# Patient Record
Sex: Female | Born: 1960 | Race: White | Hispanic: No | Marital: Married | Smoking: Former smoker
Health system: Southern US, Community
[De-identification: ages and names within clinical notes are randomized; demographics above are authoritative.]

## PROBLEM LIST (undated history)

## (undated) DIAGNOSIS — J45909 Unspecified asthma, uncomplicated: Secondary | ICD-10-CM

## (undated) DIAGNOSIS — J309 Allergic rhinitis, unspecified: Secondary | ICD-10-CM

## (undated) DIAGNOSIS — E785 Hyperlipidemia, unspecified: Secondary | ICD-10-CM

## (undated) DIAGNOSIS — K635 Polyp of colon: Secondary | ICD-10-CM

## (undated) DIAGNOSIS — I10 Essential (primary) hypertension: Secondary | ICD-10-CM

## (undated) HISTORY — PX: APPENDECTOMY: SHX54

## (undated) HISTORY — DX: Essential (primary) hypertension: I10

## (undated) HISTORY — DX: Unspecified asthma, uncomplicated: J45.909

## (undated) HISTORY — DX: Allergic rhinitis, unspecified: J30.9

## (undated) HISTORY — DX: Hyperlipidemia, unspecified: E78.5

## (undated) HISTORY — PX: RHINOPLASTY: SUR1284

## (undated) HISTORY — DX: Polyp of colon: K63.5

---

## 1997-12-18 ENCOUNTER — Encounter: Payer: Self-pay | Admitting: Gynecology

## 1997-12-18 ENCOUNTER — Ambulatory Visit (HOSPITAL_COMMUNITY): Admission: RE | Admit: 1997-12-18 | Discharge: 1997-12-18 | Payer: Self-pay | Admitting: Gynecology

## 1998-03-26 ENCOUNTER — Other Ambulatory Visit: Admission: RE | Admit: 1998-03-26 | Discharge: 1998-03-26 | Payer: Self-pay | Admitting: Gynecology

## 1999-04-18 ENCOUNTER — Other Ambulatory Visit: Admission: RE | Admit: 1999-04-18 | Discharge: 1999-04-18 | Payer: Self-pay | Admitting: Gynecology

## 2000-08-20 ENCOUNTER — Encounter: Payer: Self-pay | Admitting: Gynecology

## 2000-08-20 ENCOUNTER — Ambulatory Visit (HOSPITAL_COMMUNITY): Admission: RE | Admit: 2000-08-20 | Discharge: 2000-08-20 | Payer: Self-pay | Admitting: Gynecology

## 2000-10-20 ENCOUNTER — Other Ambulatory Visit: Admission: RE | Admit: 2000-10-20 | Discharge: 2000-10-20 | Payer: Self-pay | Admitting: Gynecology

## 2003-03-30 ENCOUNTER — Ambulatory Visit (HOSPITAL_COMMUNITY): Admission: RE | Admit: 2003-03-30 | Discharge: 2003-03-30 | Payer: Self-pay | Admitting: Surgery

## 2003-03-30 ENCOUNTER — Encounter (INDEPENDENT_AMBULATORY_CARE_PROVIDER_SITE_OTHER): Payer: Self-pay | Admitting: Specialist

## 2003-11-21 ENCOUNTER — Ambulatory Visit (HOSPITAL_COMMUNITY): Admission: RE | Admit: 2003-11-21 | Discharge: 2003-11-21 | Payer: Self-pay | Admitting: Gynecology

## 2003-12-13 ENCOUNTER — Ambulatory Visit: Payer: Self-pay | Admitting: Family Medicine

## 2003-12-26 ENCOUNTER — Other Ambulatory Visit: Admission: RE | Admit: 2003-12-26 | Discharge: 2003-12-26 | Payer: Self-pay | Admitting: Gynecology

## 2004-11-27 ENCOUNTER — Ambulatory Visit (HOSPITAL_COMMUNITY): Admission: RE | Admit: 2004-11-27 | Discharge: 2004-11-27 | Payer: Self-pay | Admitting: Gynecology

## 2006-01-30 ENCOUNTER — Other Ambulatory Visit: Admission: RE | Admit: 2006-01-30 | Discharge: 2006-01-30 | Payer: Self-pay | Admitting: Gynecology

## 2006-10-02 ENCOUNTER — Ambulatory Visit (HOSPITAL_COMMUNITY): Admission: RE | Admit: 2006-10-02 | Discharge: 2006-10-02 | Payer: Self-pay | Admitting: Obstetrics and Gynecology

## 2007-10-14 ENCOUNTER — Ambulatory Visit: Payer: Self-pay | Admitting: Women's Health

## 2007-10-15 ENCOUNTER — Ambulatory Visit: Payer: Self-pay | Admitting: Women's Health

## 2007-10-15 ENCOUNTER — Encounter: Admission: RE | Admit: 2007-10-15 | Discharge: 2007-10-15 | Payer: Self-pay | Admitting: Gynecology

## 2007-10-15 ENCOUNTER — Encounter: Payer: Self-pay | Admitting: Women's Health

## 2007-10-15 ENCOUNTER — Other Ambulatory Visit: Admission: RE | Admit: 2007-10-15 | Discharge: 2007-10-15 | Payer: Self-pay | Admitting: Gynecology

## 2008-01-14 HISTORY — PX: CHOLECYSTECTOMY: SHX55

## 2008-11-03 ENCOUNTER — Ambulatory Visit (HOSPITAL_COMMUNITY): Admission: RE | Admit: 2008-11-03 | Discharge: 2008-11-03 | Payer: Self-pay | Admitting: Gynecology

## 2009-01-13 DIAGNOSIS — K635 Polyp of colon: Secondary | ICD-10-CM

## 2009-01-13 HISTORY — DX: Polyp of colon: K63.5

## 2009-12-14 ENCOUNTER — Ambulatory Visit (HOSPITAL_COMMUNITY)
Admission: RE | Admit: 2009-12-14 | Discharge: 2009-12-14 | Payer: Self-pay | Source: Home / Self Care | Admitting: Gynecology

## 2010-02-03 ENCOUNTER — Encounter: Payer: Self-pay | Admitting: Obstetrics and Gynecology

## 2010-05-31 ENCOUNTER — Other Ambulatory Visit (HOSPITAL_COMMUNITY)
Admission: RE | Admit: 2010-05-31 | Discharge: 2010-05-31 | Disposition: A | Discharge status: Home / Self Care | Payer: BC Managed Care – PPO | Source: Ambulatory Visit | Attending: Gynecology | Admitting: Gynecology

## 2010-05-31 ENCOUNTER — Other Ambulatory Visit: Payer: Self-pay | Admitting: Women's Health

## 2010-05-31 ENCOUNTER — Encounter (INDEPENDENT_AMBULATORY_CARE_PROVIDER_SITE_OTHER): Payer: BC Managed Care – PPO | Admitting: Women's Health

## 2010-05-31 DIAGNOSIS — Z01419 Encounter for gynecological examination (general) (routine) without abnormal findings: Secondary | ICD-10-CM

## 2010-05-31 DIAGNOSIS — Z124 Encounter for screening for malignant neoplasm of cervix: Secondary | ICD-10-CM | POA: Insufficient documentation

## 2010-05-31 NOTE — Op Note (Signed)
Carla Cline, Carla Cline                         ACCOUNT NO.:  [id number]   MEDICAL RECORD NO.:  [id number]                   PATIENT TYPE:  OIB   LOCATION:  NA                                   FACILITY:  [facility]   PHYSICIAN:  Abigail Miyamoto, M.D.              DATE OF BIRTH:  12/12/1960   DATE OF PROCEDURE:  03/30/2003  DATE OF DISCHARGE:                                 OPERATIVE REPORT   PREOPERATIVE DIAGNOSIS:  Symptomatic cholelithiasis.   POSTOPERATIVE DIAGNOSIS:  Symptomatic cholelithiasis.   PROCEDURE:  Laparoscopic cholecystectomy.   SURGEON:  Abigail Miyamoto, M.D.   ASSISTANT:  Sheppard Plumber. Earlene Plater, M.D.   ANESTHESIA:  General endotracheal anesthesia.   ESTIMATED BLOOD LOSS:  Minimal.   FINDINGS:  The patient was found to have a gallbladder full of gallstones  and chronically thick-walled in appearance.   PROCEDURE IN DETAIL:  The patient was brought to the operating room and  identified as Carla Cline.  She was placed supine on the operating table  and general anesthesia was induced.  Her abdomen was then prepped and draped  in the usual sterile fashion.  Using a #15 blade, a small transverse  incision was made just below the umbilicus through a previous scar.  The  incision was carried down to the fascia, which was then opened with a  scalpel.  A hemostat was then used to pass through the peritoneal cavity.  Next a 0 Vicryl pursestring suture was placed around the fascial opening.  The Hasson port was placed through the abdomen and insufflation of the  abdomen was begun.  A 12 mm port was then placed in the patient's  epigastrium and two 5 mm ports were placed in the patient's right flank  under direct vision.  The gallbladder was then grasped and retracted above  the liver bed.  Several adhesions to the liver and fundus of the gallbladder  were taken down bluntly.  The cystic duct was then dissected out.  It was  viewed circumferentially and a critical window was  obtained.  The cystic  duct was then clipped three times proximally, once distally, and transected  with the scissors.  The cystic artery was then identified and clipped twice  proximally, once distally, and transected as well.  The gallbladder was then  dissected free from the liver bed with the electrocautery.  Hemostasis was  achieved in the liver bed with the electrocautery.  Once the gallbladder was  freed from the liver bed, it was placed in an Endosac and removed through  the incision at the umbilicus.  The liver bed was again examined and  hemostasis was achieved with the cautery.  The abdomen was then thoroughly  irrigated with normal saline.  All ports were then removed under direct  vision and the abdomen was deflated.  All incisions were anesthetized with  0.25% Marcaine and closed with 4-0 Monocryl subcuticular sutures.  Steri-Strips, gauze, and tape were then applied.  The patient tolerated the  procedure well.  All counts were correct at the end of the procedure and the  patient was then extubated in the operating room and taken in stable  condition to the recovery room.                                               Abigail Miyamoto, M.D.    DB/MEDQ  D:  03/30/2003  T:  04/01/2003  Job:  [id number]

## 2011-03-25 ENCOUNTER — Other Ambulatory Visit: Payer: Self-pay | Admitting: Women's Health

## 2011-03-25 DIAGNOSIS — Z1231 Encounter for screening mammogram for malignant neoplasm of breast: Secondary | ICD-10-CM

## 2011-04-18 ENCOUNTER — Ambulatory Visit (HOSPITAL_COMMUNITY): Payer: BC Managed Care – PPO | Attending: Women's Health

## 2011-05-16 ENCOUNTER — Ambulatory Visit (HOSPITAL_COMMUNITY)
Admission: RE | Admit: 2011-05-16 | Discharge: 2011-05-16 | Disposition: A | Discharge status: Home / Self Care | Payer: BC Managed Care – PPO | Source: Ambulatory Visit | Attending: Women's Health | Admitting: Women's Health

## 2011-05-16 DIAGNOSIS — Z1231 Encounter for screening mammogram for malignant neoplasm of breast: Secondary | ICD-10-CM | POA: Insufficient documentation

## 2011-10-15 ENCOUNTER — Encounter: Payer: Self-pay | Admitting: Women's Health

## 2011-10-15 ENCOUNTER — Ambulatory Visit (INDEPENDENT_AMBULATORY_CARE_PROVIDER_SITE_OTHER): Payer: BC Managed Care – PPO | Admitting: Women's Health

## 2011-10-15 VITALS — BP 122/86 | Ht 59.0 in | Wt 149.0 lb

## 2011-10-15 DIAGNOSIS — Z01419 Encounter for gynecological examination (general) (routine) without abnormal findings: Secondary | ICD-10-CM

## 2011-10-15 DIAGNOSIS — D126 Benign neoplasm of colon, unspecified: Secondary | ICD-10-CM

## 2011-10-15 DIAGNOSIS — I10 Essential (primary) hypertension: Secondary | ICD-10-CM | POA: Insufficient documentation

## 2011-10-15 DIAGNOSIS — E7800 Pure hypercholesterolemia, unspecified: Secondary | ICD-10-CM | POA: Insufficient documentation

## 2011-10-15 DIAGNOSIS — K635 Polyp of colon: Secondary | ICD-10-CM | POA: Insufficient documentation

## 2011-10-15 DIAGNOSIS — N912 Amenorrhea, unspecified: Secondary | ICD-10-CM

## 2011-10-15 NOTE — Patient Instructions (Addendum)
Vit D 2000 daily Health Maintenance, Females A healthy lifestyle and preventative care can promote health and wellness.  Maintain regular health, dental, and eye exams.  Eat a healthy diet. Foods like vegetables, fruits, whole grains, low-fat dairy products, and lean protein foods contain the nutrients you need without too many calories. Decrease your intake of foods high in solid fats, added sugars, and salt. Get information about a proper diet from your caregiver, if necessary.  Regular physical exercise is one of the most important things you can do for your health. Most adults should get at least 150 minutes of moderate-intensity exercise (any activity that increases your heart rate and causes you to sweat) each week. In addition, most adults need muscle-strengthening exercises on 2 or more days a week.   Maintain a healthy weight. The body mass index (BMI) is a screening tool to identify possible weight problems. It provides an estimate of body fat based on height and weight. Your caregiver can help determine your BMI, and can help you achieve or maintain a healthy weight. For adults 20 years and older:  A BMI below 18.5 is considered underweight.  A BMI of 18.5 to 24.9 is normal.  A BMI of 25 to 29.9 is considered overweight.  A BMI of 30 and above is considered obese.  Maintain normal blood lipids and cholesterol by exercising and minimizing your intake of saturated fat. Eat a balanced diet with plenty of fruits and vegetables. Blood tests for lipids and cholesterol should begin at age 20 and be repeated every 5 years. If your lipid or cholesterol levels are high, you are over 50, or you are a high risk for heart disease, you may need your cholesterol levels checked more frequently.Ongoing high lipid and cholesterol levels should be treated with medicines if diet and exercise are not effective.  If you smoke, find out from your caregiver how to quit. If you do not use tobacco, do not  start.  If you are pregnant, do not drink alcohol. If you are breastfeeding, be very cautious about drinking alcohol. If you are not pregnant and choose to drink alcohol, do not exceed 1 drink per day. One drink is considered to be 12 ounces (355 mL) of beer, 5 ounces (148 mL) of wine, or 1.5 ounces (44 mL) of liquor.  Avoid use of street drugs. Do not share needles with anyone. Ask for help if you need support or instructions about stopping the use of drugs.  High blood pressure causes heart disease and increases the risk of stroke. Blood pressure should be checked at least every 1 to 2 years. Ongoing high blood pressure should be treated with medicines, if weight loss and exercise are not effective.  If you are 55 to 53 years old, ask your caregiver if you should take aspirin to prevent strokes.  Diabetes screening involves taking a blood sample to check your fasting blood sugar level. This should be done once every 3 years, after age 45, if you are within normal weight and without risk factors for diabetes. Testing should be considered at a younger age or be carried out more frequently if you are overweight and have at least 1 risk factor for diabetes.  Breast cancer screening is essential preventative care for women. You should practice "breast self-awareness." This means understanding the normal appearance and feel of your breasts and may include breast self-examination. Any changes detected, no matter how small, should be reported to a caregiver. Women in their 20s   and 30s should have a clinical breast exam (CBE) by a caregiver as part of a regular health exam every 1 to 3 years. After age 40, women should have a CBE every year. Starting at age 40, women should consider having a mammogram (breast X-ray) every year. Women who have a family history of breast cancer should talk to their caregiver about genetic screening. Women at a high risk of breast cancer should talk to their caregiver about having  an MRI and a mammogram every year.  The Pap test is a screening test for cervical cancer. Women should have a Pap test starting at age 21. Between ages 21 and 29, Pap tests should be repeated every 2 years. Beginning at age 30, you should have a Pap test every 3 years as long as the past 3 Pap tests have been normal. If you had a hysterectomy for a problem that was not cancer or a condition that could lead to cancer, then you no longer need Pap tests. If you are between ages 65 and 70, and you have had normal Pap tests going back 10 years, you no longer need Pap tests. If you have had past treatment for cervical cancer or a condition that could lead to cancer, you need Pap tests and screening for cancer for at least 20 years after your treatment. If Pap tests have been discontinued, risk factors (such as a new sexual partner) need to be reassessed to determine if screening should be resumed. Some women have medical problems that increase the chance of getting cervical cancer. In these cases, your caregiver may recommend more frequent screening and Pap tests.  The human papillomavirus (HPV) test is an additional test that may be used for cervical cancer screening. The HPV test looks for the virus that can cause the cell changes on the cervix. The cells collected during the Pap test can be tested for HPV. The HPV test could be used to screen women aged 30 years and older, and should be used in women of any age who have unclear Pap test results. After the age of 30, women should have HPV testing at the same frequency as a Pap test.  Colorectal cancer can be detected and often prevented. Most routine colorectal cancer screening begins at the age of 50 and continues through age 75. However, your caregiver may recommend screening at an earlier age if you have risk factors for colon cancer. On a yearly basis, your caregiver may provide home test kits to check for hidden blood in the stool. Use of a small camera at  the end of a tube, to directly examine the colon (sigmoidoscopy or colonoscopy), can detect the earliest forms of colorectal cancer. Talk to your caregiver about this at age 50, when routine screening begins. Direct examination of the colon should be repeated every 5 to 10 years through age 75, unless early forms of pre-cancerous polyps or small growths are found.  Hepatitis C blood testing is recommended for all people born from 1945 through 1965 and any individual with known risks for hepatitis C.  Practice safe sex. Use condoms and avoid high-risk sexual practices to reduce the spread of sexually transmitted infections (STIs). Sexually active women aged 25 and younger should be checked for Chlamydia, which is a common sexually transmitted infection. Older women with new or multiple partners should also be tested for Chlamydia. Testing for other STIs is recommended if you are sexually active and at increased risk.  Osteoporosis is a   disease in which the bones lose minerals and strength with aging. This can result in serious bone fractures. The risk of osteoporosis can be identified using a bone density scan. Women ages 65 and over and women at risk for fractures or osteoporosis should discuss screening with their caregivers. Ask your caregiver whether you should be taking a calcium supplement or vitamin D to reduce the rate of osteoporosis.  Menopause can be associated with physical symptoms and risks. Hormone replacement therapy is available to decrease symptoms and risks. You should talk to your caregiver about whether hormone replacement therapy is right for you.  Use sunscreen with a sun protection factor (SPF) of 30 or greater. Apply sunscreen liberally and repeatedly throughout the day. You should seek shade when your shadow is shorter than you. Protect yourself by wearing long sleeves, pants, a wide-brimmed hat, and sunglasses year round, whenever you are outdoors.  Notify your caregiver of new  moles or changes in moles, especially if there is a change in shape or color. Also notify your caregiver if a mole is larger than the size of a pencil eraser.  Stay current with your immunizations. Document Released: 07/15/2010 Document Revised: 03/24/2011 Document Reviewed: 07/15/2010 [employer] Patient Information 2013 [employer].  

## 2011-10-15 NOTE — Progress Notes (Signed)
JIWOO OTT 06/25/1960 [id number]    History:    The patient presents for annual exam.  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 Cycles are every 1-3 months last cycle September and states feels like she is ready to start a period. History of normal Paps and mammograms.   Past medical history, past surgical history, family history and social history were all reviewed and documented in the EPIC chart. Mother with a diagnosis of breast cancer with unknown BRCA testing. History of benign colon polyps 2011. Hypertension and hypercholesterolemia treated by primary care. Daughter [name] doing well, son [name] 18 struggling with school. Engineer, production for [employer].  ROS:  A  ROS was performed and pertinent positives and negatives are included in the history.  Exam:  Filed Vitals:   10/15/11 1515  BP: 122/86    General appearance:  Normal Head/Neck:  Normal, without cervical or supraclavicular adenopathy. Thyroid:  Symmetrical, normal in size, without palpable masses or nodularity. Respiratory  Effort:  Normal  Auscultation:  Clear without wheezing or rhonchi Cardiovascular  Auscultation:  Regular rate, without rubs, murmurs or gallops  Edema/varicosities:  Not grossly evident Abdominal  Soft,nontender, without masses, guarding or rebound.  Liver/spleen:  No organomegaly noted  Hernia:  None appreciated  Skin  Inspection:  Grossly normal  Palpation:  Grossly normal Neurologic/psychiatric  Orientation:  Normal with appropriate conversation.  Mood/affect:  Normal  Genitourinary    Breasts: Examined lying and sitting.     Right: Without masses, retractions, discharge or axillary adenopathy.     Left: Without masses, retractions, discharge or axillary adenopathy.   Inguinal/mons:  Normal without inguinal adenopathy  External genitalia:  Normal  BUS/Urethra/Skene's glands:  Normal  Bladder:  Normal  Vagina:  Normal  Cervix:  Normal  Uterus:   normal in size, shape and  contour.  Midline and mobile  Adnexa/parametria:     Rt: Without masses or tenderness.   Lt: Without masses or tenderness.  Anus and perineum: Normal  Digital rectal exam: Normal sphincter tone without palpated masses or tenderness  Assessment/Plan:  51 y.o. MWF G1 P1 +1 adopted  for annual exam.    Perimenopausal/irregular cycles x6 months Hypertension/hypercholesterolemia-primary care labs and meds Benign colon polyps 2011  Plan: Discussed BRCA testing Will review with her mother who was diagnosed with breast cancer in her 47s. Declines testing today. SBE's, continue annual mammogram, 3D mammogram reviewed. Reviewed importance of exercise, calcium rich diet, vitamin D 2000 daily, menopause reviewed. No Pap history of normal Paps, new screening guidelines reviewed. Home Hemoccult card with instructions given.    Harrington Challenger WHNP, 4:10 PM 10/15/2011

## 2012-02-02 ENCOUNTER — Emergency Department (HOSPITAL_COMMUNITY): Payer: BC Managed Care – PPO

## 2012-02-02 ENCOUNTER — Emergency Department (HOSPITAL_COMMUNITY)
Admission: EM | Admit: 2012-02-02 | Discharge: 2012-02-02 | Disposition: A | Discharge status: Home / Self Care | Payer: BC Managed Care – PPO | Attending: Emergency Medicine | Admitting: Emergency Medicine

## 2012-02-02 ENCOUNTER — Encounter (HOSPITAL_COMMUNITY): Payer: Self-pay | Admitting: Emergency Medicine

## 2012-02-02 DIAGNOSIS — E785 Hyperlipidemia, unspecified: Secondary | ICD-10-CM | POA: Insufficient documentation

## 2012-02-02 DIAGNOSIS — R059 Cough, unspecified: Secondary | ICD-10-CM | POA: Insufficient documentation

## 2012-02-02 DIAGNOSIS — I10 Essential (primary) hypertension: Secondary | ICD-10-CM | POA: Insufficient documentation

## 2012-02-02 DIAGNOSIS — Z79899 Other long term (current) drug therapy: Secondary | ICD-10-CM | POA: Insufficient documentation

## 2012-02-02 DIAGNOSIS — J45901 Unspecified asthma with (acute) exacerbation: Secondary | ICD-10-CM | POA: Insufficient documentation

## 2012-02-02 DIAGNOSIS — Z860100 Personal history of colon polyps, unspecified: Secondary | ICD-10-CM | POA: Insufficient documentation

## 2012-02-02 DIAGNOSIS — J45909 Unspecified asthma, uncomplicated: Secondary | ICD-10-CM

## 2012-02-02 DIAGNOSIS — Z87891 Personal history of nicotine dependence: Secondary | ICD-10-CM | POA: Insufficient documentation

## 2012-02-02 DIAGNOSIS — Z8701 Personal history of pneumonia (recurrent): Secondary | ICD-10-CM | POA: Insufficient documentation

## 2012-02-02 DIAGNOSIS — R05 Cough: Secondary | ICD-10-CM | POA: Insufficient documentation

## 2012-02-02 LAB — BASIC METABOLIC PANEL
Chloride: 99 mEq/L (ref 96–112)
Creatinine, Ser: 0.79 mg/dL (ref 0.50–1.10)
GFR calc Af Amer: >90 mL/min (ref >90)
Glucose, Bld: 124 mg/dL — ABNORMAL HIGH (ref 70–99)
Potassium: 3.8 mEq/L (ref 3.5–5.1)

## 2012-02-02 LAB — CBC WITH DIFFERENTIAL/PLATELET
Basophils Relative: 0 % (ref 0–1)
Eosinophils Absolute: 0.2 10*3/uL (ref 0.0–0.7)
Eosinophils Relative: 2 % (ref 0–5)
Hemoglobin: 16.9 g/dL — ABNORMAL HIGH (ref 12.0–15.0)
Lymphocytes Relative: 42 % (ref 12–46)
Monocytes Relative: 5 % (ref 3–12)
Neutro Abs: 4.5 10*3/uL (ref 1.7–7.7)
Neutrophils Relative %: 50 % (ref 43–77)
Platelets: 260 10*3/uL (ref 150–400)
RDW: 12.8 % (ref 11.5–15.5)

## 2012-02-02 LAB — TROPONIN I: Troponin I: <0.3 ng/mL (ref <0.30)

## 2012-02-02 MED ORDER — PREDNISONE 20 MG PO TABS: 40.0000 mg | ORAL_TABLET | Freq: Every day | ORAL | Status: DC

## 2012-02-02 MED ORDER — ALBUTEROL SULFATE (5 MG/ML) 0.5% IN NEBU
5.0000 mg | INHALATION_SOLUTION | RESPIRATORY_TRACT | Status: AC
  Administered 2012-02-02: 5 mg via RESPIRATORY_TRACT
  Filled 2012-02-02: qty 40

## 2012-02-02 MED ORDER — ONDANSETRON HCL 4 MG/2ML IJ SOLN
4.0000 mg | Freq: Once | INTRAMUSCULAR | Status: AC
  Administered 2012-02-02: 4 mg via INTRAVENOUS
  Filled 2012-02-02: qty 2

## 2012-02-02 MED ORDER — ALBUTEROL SULFATE HFA 108 (90 BASE) MCG/ACT IN AERS: 2.0000 | INHALATION_SPRAY | RESPIRATORY_TRACT | Status: DC | PRN

## 2012-02-02 MED ORDER — METHYLPREDNISOLONE SODIUM SUCC 125 MG IJ SOLR
125.0000 mg | Freq: Once | INTRAMUSCULAR | Status: AC
  Administered 2012-02-02: 125 mg via INTRAVENOUS
  Filled 2012-02-02: qty 2

## 2012-02-02 MED ORDER — ONDANSETRON HCL 4 MG/2ML IJ SOLN
INTRAMUSCULAR | Status: AC
  Filled 2012-02-02: qty 2

## 2012-02-02 NOTE — ED Notes (Signed)
MD at bedside. 

## 2012-02-02 NOTE — ED Provider Notes (Addendum)
History     CSN: [id number]  Arrival date & time 02/02/12  4098   First MD Initiated Contact with Patient 02/02/12 0320      Chief Complaint  Patient presents with  . Shortness of Breath    (Consider location/radiation/quality/duration/timing/severity/associated sxs/prior treatment) HPI Comments: 52 y/o female with hx of htn, hypercholesterolemia who presents with intermittent SOB over the last week - she states that she usually has the SOB when she is exercising - lasting for < 20 minutes and assocaited with a  Cough but tonight the sx became much more prominent causing her to come to the ED - they would not go away and have been present > 3 hours.  They are persistent now, associated with coughing and wheezing but not associated with sore throat, nasal congestion, back pain, swelling in the legs, trauma, travel, recent surgery, hormone use, immobilization, smoking. She does not have a history of asthma or any obstructive pulmonary disease and she has had one episode of pneumonia last year. She takes lisinopril for her blood pressure. She does have an albuterol inhaler which she has been using at the gym which seems to be helping her. This was a leftover medication from her pneumonia last year.  The history is provided by the patient and the spouse.    Past Medical History  Diagnosis Date  . Hypertension   . Colon polyps 2011    benign  . Hyperlipidemia     Past Surgical History  Procedure Date  . Rhinoplasty   . Cesarean section     Family History  Problem Relation Age of Onset  . Hypertension Mother   . Breast cancer Mother     age 54's  . Hypertension Father   . Heart disease Father     History  Substance Use Topics  . Smoking status: Former Smoker    Types: Cigarettes    Quit date: 10/15/1983  . Smokeless tobacco: Not on file  . Alcohol Use: Yes    OB History    Grav Para Term Preterm Abortions TAB SAB Ect Mult Living   1         1      Review of Systems   All other systems reviewed and are negative.    Allergies  Review of patient's allergies indicates no known allergies.  Home Medications   Current Outpatient Rx  Name  Route  Sig  Dispense  Refill  . ALBUTEROL SULFATE HFA 108 (90 BASE) MCG/ACT IN AERS   Inhalation   Inhale 2 puffs into the lungs every 6 (six) hours as needed. For shortness of breath         . VITAMIN C 1000 MG PO TABS   Oral   Take 2,000 mg by mouth daily.         Marland Kitchen HYDROCHLOROTHIAZIDE 12.5 MG PO CAPS   Oral   Take 12.5 mg by mouth daily.         . IBUPROFEN 200 MG PO TABS   Oral   Take 600 mg by mouth every 6 (six) hours as needed. For pain         . IRON PO   Oral   Take 1 tablet by mouth daily. OTC         . LISINOPRIL 20 MG PO TABS   Oral   Take 20 mg by mouth daily.         Marland Kitchen LORATADINE 10 MG PO TABS   Oral  Take 10 mg by mouth daily.         Marland Kitchen SIMVASTATIN 10 MG PO TABS   Oral   Take 10 mg by mouth at bedtime.           . ALBUTEROL SULFATE HFA 108 (90 BASE) MCG/ACT IN AERS   Inhalation   Inhale 2 puffs into the lungs every 4 (four) hours as needed for wheezing or shortness of breath.   1 Inhaler   3   . PREDNISONE 20 MG PO TABS   Oral   Take 2 tablets (40 mg total) by mouth daily.   10 tablet   0     BP 115/74  Pulse 119  Temp 98 F (36.7 C) (Oral)  Resp 20  Ht 5' (1.524 m)  Wt 145 lb (65.772 kg)  BMI 28.32 kg/m2  SpO2 95%  LMP 01/02/2012  Physical Exam  Nursing note and vitals reviewed. Constitutional: She appears well-developed and well-nourished.  HENT:  Head: Normocephalic and atraumatic.  Mouth/Throat: Oropharynx is clear and moist. No oropharyngeal exudate.       Mucous membranes are moist, nasal passages are clear, there is no pharyngeal erythema exudate or hypertrophy or erythema. The posterior pharynx otherwise appears normal.  Eyes: Conjunctivae normal and EOM are normal. Pupils are equal, round, and reactive to light. Right eye exhibits no  discharge. Left eye exhibits no discharge. No scleral icterus.  Neck: Normal range of motion. Neck supple. No JVD present. No thyromegaly present.       There is no lymphadenopathy in the patient's very supple neck  Cardiovascular: Regular rhythm, normal heart sounds and intact distal pulses.  Exam reveals no gallop and no friction rub.   No murmur heard.      Tachycardia to 120 beats per minute, no murmurs, strong pulses at the radial arteries, no JVD  Pulmonary/Chest:       Increased work of breathing is present, she is mildly tachypneic, speaking in full sentences, decreased air movement with mild expiratory wheezing bilaterally. No rales, no accessory muscle use  Abdominal: Soft. Bowel sounds are normal. She exhibits no distension and no mass. There is no tenderness.  Musculoskeletal: Normal range of motion. She exhibits no edema and no tenderness.  Lymphadenopathy:    She has no cervical adenopathy.  Neurological: She is alert. Coordination normal.  Skin: Skin is warm and dry. No rash noted. No erythema.  Psychiatric: She has a normal mood and affect. Her behavior is normal.    ED Course  Procedures (including critical care time)  Labs Reviewed  CBC WITH DIFFERENTIAL - Abnormal; Notable for the following:    RBC 5.51 (*)     Hemoglobin 16.9 (*)     MCHC 36.7 (*)     All other components within normal limits  BASIC METABOLIC PANEL - Abnormal; Notable for the following:    Glucose, Bld 124 (*)     Calcium 11.1 (*)     All other components within normal limits  TROPONIN I  PRO B NATRIURETIC PEPTIDE   Dg Chest 2 View  02/02/2012  *RADIOLOGY REPORT*  Clinical Data: Shortness of breath.  Near-syncope.  CHEST - 2 VIEW  Comparison: Chest radiograph performed 03/28/2003  Findings: The lungs are well-aerated.  Mild peribronchial thickening is noted.  There is no evidence of focal opacification, pleural effusion or pneumothorax.  The heart is normal in size; the mediastinal contour is  within normal limits.  No acute osseous abnormalities are seen.  Clips are noted within the right upper quadrant, reflecting prior cholecystectomy.  IMPRESSION: Mild peribronchial thickening noted; lungs otherwise clear.   Original Report Authenticated By: Tonia Ghent, M.D.      1. Reactive airway disease       MDM  The patient is mildly hypoxic with an oxygen saturation of 95% on room air, a pulse of 120 and is afebrile with a normal blood pressure. She does not have any risk factors for pulmonary embolism, she does have an element of reactive airway disease in her exam but I would also consider angioedema though I don't see any visualized angioedema in her pharynx or mouth. Prednisone, albuterol, chest x-ray and labs pending  ED ECG REPORT  I personally interpreted this EKG   Date: 02/02/2012   Rate: 94  Rhythm: normal sinus rhythm  QRS Axis: normal  Intervals: normal  ST/T Wave abnormalities: normal  Conduction Disutrbances:none  Narrative Interpretation:   Old EKG Reviewed: Since 03/30/2003, no significant changes other than an increase in rate   PA and lateral views of the chest were obtained by digital radiography. I have personally interpreted these x-rays and find her to be no signs of pulmonary infiltrate, cardiomegaly, subdiaphragmatic free air, soft tissue abnormality, no obvious bony abnormalities or fractures.  Lab results are overall very unremarkable and the patient has improved significantly after receiving an albuterol treatment. She has been given steroids, EKG is unremarkable and shows no signs of ischemia, the patient appears stable for discharge with followup with her family Dr.  Vida Roller, MD 02/02/12 9147  Vida Roller, MD 02/02/12 [phone]

## 2012-02-02 NOTE — ED Notes (Signed)
Pt has been having SOB episodes lasting approx 20 minutes for undetermined amount of time, but tonight she had an episode and it didn't go away. Pt is having slightly labored breathing with expiratory wheezing. Pt denies any hx of blood clots and does not take birth control.

## 2012-02-02 NOTE — ED Notes (Signed)
Pt states that she is starting to feel nauseous. Zofran 4mg  ordered per protocol

## 2012-02-06 ENCOUNTER — Ambulatory Visit (INDEPENDENT_AMBULATORY_CARE_PROVIDER_SITE_OTHER): Payer: BC Managed Care – PPO | Admitting: Internal Medicine

## 2012-02-06 ENCOUNTER — Encounter: Payer: Self-pay | Admitting: Internal Medicine

## 2012-02-06 ENCOUNTER — Other Ambulatory Visit: Payer: BC Managed Care – PPO

## 2012-02-06 VITALS — BP 112/76 | HR 72 | Ht 60.0 in | Wt 149.6 lb

## 2012-02-06 DIAGNOSIS — J309 Allergic rhinitis, unspecified: Secondary | ICD-10-CM

## 2012-02-06 DIAGNOSIS — R0609 Other forms of dyspnea: Secondary | ICD-10-CM

## 2012-02-06 MED ORDER — VALSARTAN 40 MG PO TABS: 40.0000 mg | ORAL_TABLET | Freq: Every day | ORAL | Status: DC

## 2012-02-06 NOTE — Patient Instructions (Addendum)
Order- schedule PFT     Dx dyspnea on exertion              Lab- Allergy Profile    Dx allergic rhinitis   Script for Diovan-   For one month, take this instead of Lisinopril. See if there is any impact on breathing or on these episodes of mid-chest pain  Ok to continue using the rescue inhaler but please call our office with the names of the inhalers you have for our records.

## 2012-02-06 NOTE — Progress Notes (Signed)
02/06/12- 52 yoF former smoker -Self Referral-coughing spells; felt like she swallowed something and couldnt breathe-went to ER; also SOB happens about 20 minutes when working out. Reports breathing problems for 6 months especially during exertion such that she has to stop early. Cough and wheeze. Inhaler seems to help. Stress makes it worse. One week ago she had a new "esophageal" pain during stress, not while eating. She said was like something stuck. EKG was okay. Similar episode woke her from sleep and got more intense with shortness of breath over several minutes. Nebulizer at emergency room definitely helped. Some sense of food hang up. Now 3 episodes like this. TUMS antacid does not help. She is tapering prednisone. History of seasonal allergy treated with loratadine. No history of asthma. Pneumonia 6 months ago. She has been on lisinopril and we discussed possible effects of that on the upper airway  Reflux in the past. CXR 02/02/12- IMPRESSION:  Mild peribronchial thickening noted; lungs otherwise clear.  Original Report Authenticated By: Tonia Ghent, M.D.   Prior to Admission medications   Medication Sig Start Date End Date Taking? Authorizing Provider  albuterol (PROVENTIL HFA;VENTOLIN HFA) 108 (90 BASE) MCG/ACT inhaler Inhale 2 puffs into the lungs every 4 (four) hours as needed for wheezing or shortness of breath. 02/02/12  Yes Vida Roller, MD  Ascorbic Acid (VITAMIN C) 1000 MG tablet Take 2,000 mg by mouth daily.   Yes Historical Provider, MD  Calcium Carb-Cholecalciferol (CALCIUM + D3) 600-200 MG-UNIT TABS Take 2 tablets by mouth daily.   Yes Historical Provider, MD  Cholecalciferol (VITAMIN D) 2000 UNITS tablet Take 2,000 Units by mouth daily.   Yes Historical Provider, MD  ibuprofen (ADVIL,MOTRIN) 200 MG tablet Take 600 mg by mouth every 6 (six) hours as needed. For pain   Yes Historical Provider, MD  IRON PO Take 1 tablet by mouth daily. OTC   Yes Historical Provider, MD    lisinopril (PRINIVIL,ZESTRIL) 10 MG tablet Take 10 mg by mouth daily.   Yes Historical Provider, MD  loratadine (CLARITIN) 10 MG tablet Take 10 mg by mouth daily.   Yes Historical Provider, MD  predniSONE (DELTASONE) 20 MG tablet Take 2 tablets (40 mg total) by mouth daily. 02/02/12  Yes Vida Roller, MD  simvastatin (ZOCOR) 10 MG tablet Take 10 mg by mouth at bedtime.     Yes Historical Provider, MD  triamterene-hydrochlorothiazide (DYAZIDE) 37.5-25 MG per capsule Take 1 tablet by mouth daily. 12/16/11  Yes Historical Provider, MD  valsartan (DIOVAN) 40 MG tablet Take 40 mg by mouth daily.   Yes Historical Provider, MD  valsartan (DIOVAN) 40 MG tablet Take 1 tablet (40 mg total) by mouth daily. 02/06/12 03/08/12  Waymon Budge, MD   Past Medical History  Diagnosis Date  . Hypertension   . Colon polyps 2011    benign  . Hyperlipidemia   . Allergic rhinitis    Past Surgical History  Procedure Date  . Rhinoplasty 90's  . Cesarean section   . Cholecystectomy 2010   Family History  Problem Relation Age of Onset  . Hypertension Mother   . Breast cancer Mother     age 8's  . Hypertension Father   . Heart disease Father    History   Social History  . Marital Status: Married    Spouse Name: N/A    Number of Children: 2  . Years of Education: N/A   Occupational History  . 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 Smoking status: Former Smoker -- 1.0 packs/day for 10 years    Types: Cigarettes    Quit date: 10/15/1983  . Smokeless tobacco: Not on file  . Alcohol Use: Yes     Comment: weekend; wine(red)  . Drug Use: No  . Sexually Active: Yes    Birth Control/ Protection: None   Other Topics Concern  . Not on file   Social History Narrative   Has 1 biological child and 1 adopted child   ROS-see HPI Constitutional:   No-   weight loss, night sweats, fevers, chills, fatigue, lassitude. HEENT:   No-  headaches, difficulty swallowing, tooth/dental  problems, sore throat,       No-  sneezing, itching, ear ache, nasal congestion, post nasal drip,  CV:  No-   chest pain, orthopnea, PND, swelling in lower extremities, anasarca,                                  dizziness, palpitations Resp: +  shortness of breath with exertion or at rest.              No-   productive cough,  + non-productive cough,  No- coughing up of blood.              No-   change in color of mucus.  + Is wheezing.   Skin: No-   rash or lesions. GI:  No-   heartburn, indigestion, abdominal pain, nausea, vomiting, diarrhea,                 change in bowel habits, loss of appetite GU: No-   dysuria, change in color of urine, no urgency or frequency.  No- flank pain. MS:  No-   joint pain or swelling.  No- decreased range of motion.  No- back pain. Neuro-     nothing unusual Psych:  No- change in mood or affect. No depression or anxiety.  No memory loss.  OBJ- Physical Exam General- Alert, Oriented, Affect-appropriate/ calm , Distress- none acute Skin- rash-none, lesions- none, excoriation- none Lymphadenopathy- none Head- atraumatic            Eyes- Gross vision intact, PERRLA, conjunctivae and secretions clear            Ears- Hearing, canals-normal            Nose- Clear, no-Septal dev, mucus, polyps, erosion, perforation             Throat- Mallampati III , mucosa clear , drainage- none, tonsils- atrophic Neck- flexible , trachea midline, no stridor , thyroid nl, carotid no bruit Chest - symmetrical excursion , unlabored           Heart/CV- RRR , no murmur , no gallop  , no rub, nl s1 s2                           - JVD- none , edema- none, stasis changes- none, varices- none           Lung- clear to P&A, wheeze- none, cough- none , dullness-none, rub- none           Chest wall-  Abd- tender-no, distended-no, bowel sounds-present, HSM- no Br/ Gen/ Rectal- Not done, not indicated Extrem- cyanosis- none, clubbing, none, atrophy- none, strength- nl Neuro- grossly  intact to observation

## 2012-02-09 LAB — ALLERGY FULL PROFILE
Allergen, D pternoyssinus,d7: <0.1 kU/L
Allergen,Goose feathers, e70: <0.1 kU/L
Aspergillus fumigatus, IgG: <0.1 kU/L
Bermuda Grass: <0.1 kU/L
Candida Albicans: <0.1 kU/L
Common Ragweed: <0.1 kU/L
D. farinae: <0.1 kU/L
Dog Dander: <0.1 kU/L
Goldenrod: <0.1 kU/L
Helminthosporium halodes: <0.1 kU/L
House Dust Hollister: <0.1 kU/L
IgE (Immunoglobulin E), Serum: 41.3 IU/mL (ref 0.0–180.0)
Lamb's Quarters: <0.1 kU/L
Plantain: <0.1 kU/L
Stemphylium Botryosum: <0.1 kU/L
Timothy Grass: <0.1 kU/L

## 2012-02-12 NOTE — Progress Notes (Signed)
Quick Note:  Pt aware of results. ______ 

## 2012-02-16 DIAGNOSIS — J4520 Mild intermittent asthma, uncomplicated: Secondary | ICD-10-CM | POA: Insufficient documentation

## 2012-02-16 NOTE — Assessment & Plan Note (Signed)
Exercise sessions are shortened because of dyspnea. Cough and wheeze. Relieved by bronchodilators. This sounds like an asthma pattern. Plan-recommend that she talk with her doctor about stopping lisinopril. Allergy profile, PFT

## 2012-03-06 ENCOUNTER — Other Ambulatory Visit: Payer: Self-pay | Admitting: Internal Medicine

## 2012-03-18 ENCOUNTER — Encounter: Payer: Self-pay | Admitting: Internal Medicine

## 2012-03-18 ENCOUNTER — Ambulatory Visit (INDEPENDENT_AMBULATORY_CARE_PROVIDER_SITE_OTHER): Payer: BC Managed Care – PPO | Admitting: Internal Medicine

## 2012-03-18 VITALS — BP 128/80 | HR 100 | Ht 60.0 in | Wt 149.0 lb

## 2012-03-18 DIAGNOSIS — R0989 Other specified symptoms and signs involving the circulatory and respiratory systems: Secondary | ICD-10-CM

## 2012-03-18 DIAGNOSIS — R0609 Other forms of dyspnea: Secondary | ICD-10-CM

## 2012-03-18 DIAGNOSIS — J45909 Unspecified asthma, uncomplicated: Secondary | ICD-10-CM

## 2012-03-18 DIAGNOSIS — J4520 Mild intermittent asthma, uncomplicated: Secondary | ICD-10-CM

## 2012-03-18 LAB — PULMONARY FUNCTION TEST

## 2012-03-18 MED ORDER — ALBUTEROL SULFATE HFA 108 (90 BASE) MCG/ACT IN AERS: 2.0000 | INHALATION_SPRAY | RESPIRATORY_TRACT | Status: DC | PRN

## 2012-03-18 NOTE — Progress Notes (Signed)
PFT done today. 

## 2012-03-18 NOTE — Progress Notes (Signed)
02/06/12- 76 yoF former smoker -Self Referral-coughing spells; felt like she swallowed something and couldnt breathe-went to ER; also SOB happens about 20 minutes when working out. Reports breathing problems for 6 months especially during exertion such that she has to stop early. Cough and wheeze. Inhaler seems to help. Stress makes it worse. One week ago she had a new "esophageal" pain during stress, not while eating. She said was like something stuck. EKG was okay. Similar episode woke her from sleep and got more intense with shortness of breath over several minutes. Nebulizer at emergency room definitely helped. Some sense of food hang up. Now 3 episodes like this. TUMS antacid does not help. She is tapering prednisone. History of seasonal allergy treated with loratadine. No history of asthma. Pneumonia 6 months ago. She has been on lisinopril and we discussed possible effects of that on the upper airway  Reflux in the past. CXR 02/02/12- IMPRESSION:  Mild peribronchial thickening noted; lungs otherwise clear.  Original Report Authenticated By: Tonia Ghent, M.D.   03/18/12- 19 yoF former smoker followed for dyspnea and cough FOLLOWS FOR: review PFT results with patient; has been using rescue inhaler only prior to working out.  Substernal pain resolved. Notes sinus pressure, post-nasal drip perennial. Was on allergy shots in the past. PFT 03/18/12- Mild obstructive airways disease, small airways, with response to bronchodilator. DLCO mildly reduced. FVC 2.03/ 73%, FEV1 1.50/ 72%, FEV1/FVC 0.74, TLC 90%, DLCO 72%.  ROS-see HPI Constitutional:   No-   weight loss, night sweats, fevers, chills, fatigue, lassitude. HEENT:   No-  headaches, difficulty swallowing, tooth/dental problems, sore throat,       No-  sneezing, itching, ear ache, nasal congestion, post nasal drip,  CV:  No-   chest pain, orthopnea, PND, swelling in lower extremities, anasarca,                                  dizziness,  palpitations Resp: +  shortness of breath with exertion or at rest.              No-   productive cough,  No- non-productive cough,  No- coughing up of blood.              No-   change in color of mucus.  No- wheezing.   Skin: No-   rash or lesions. GI:  No-   heartburn, indigestion, abdominal pain, nausea, vomiting,  GU:  MS:  No-   joint pain or swelling.  Neuro-     nothing unusual Psych:  No- change in mood or affect. No depression or anxiety.  No memory loss.  OBJ- Physical Exam General- Alert, Oriented, Affect-appropriate/ calm , Distress- none acute Skin- rash-none, lesions- none, excoriation- none Lymphadenopathy- none Head- atraumatic            Eyes- Gross vision intact, PERRLA, conjunctivae and secretions clear            Ears- Hearing, canals-normal            Nose- Clear, no-Septal dev, mucus, polyps, erosion, perforation             Throat- Mallampati III , mucosa clear , drainage- none, tonsils- atrophic Neck- flexible , trachea midline, no stridor , thyroid nl, carotid no bruit Chest - symmetrical excursion , unlabored           Heart/CV- RRR , no murmur , no gallop  ,  no rub, nl s1 s2                           - JVD- none , edema- none, stasis changes- none, varices- none           Lung- clear to P&A, wheeze- none, cough- none , dullness-none, rub- none           Chest wall-  Abd-  Br/ Gen/ Rectal- Not done, not indicated Extrem- cyanosis- none, clubbing, none, atrophy- none, strength- nl Neuro- grossly intact to observation

## 2012-03-18 NOTE — Patient Instructions (Addendum)
Ok to use your albuterol HFA rescue inhaler if needed  Please call as needed

## 2012-03-19 NOTE — Assessment & Plan Note (Signed)
Good control using just a rescue inhaler before exercise, 2-3 days/ week.  This is appropriate for now. I have given permission not to use the Qvar maintenance inhaler now, as we watch over time. She couldn't tell that it made any difference.

## 2012-04-08 ENCOUNTER — Other Ambulatory Visit: Payer: Self-pay | Admitting: Internal Medicine

## 2012-04-16 ENCOUNTER — Telehealth: Payer: Self-pay | Admitting: Internal Medicine

## 2012-04-16 MED ORDER — VALSARTAN 40 MG PO TABS: 40.0000 mg | ORAL_TABLET | Freq: Every day | ORAL | Status: DC

## 2012-04-16 NOTE — Telephone Encounter (Signed)
Pt needed a refill on Diovan. I have sent in refill on this for 90 day supply per the pt's request. Nothing further was needed.

## 2012-04-27 ENCOUNTER — Encounter: Payer: Self-pay | Admitting: *Deleted

## 2012-04-27 NOTE — Progress Notes (Signed)
Patient ID: Carla Cline, female   DOB: Aug 03, 1960, 52 y.o.   MRN: [id number] Pt called requesting medication for hot flashes, I informed pt that OV would be require for new start on medication. She will call back to schedule appt.

## 2012-05-12 ENCOUNTER — Ambulatory Visit
Admission: RE | Admit: 2012-05-12 | Discharge: 2012-05-12 | Disposition: A | Discharge status: Home / Self Care | Payer: BC Managed Care – PPO | Source: Ambulatory Visit | Attending: Family Medicine | Admitting: Family Medicine

## 2012-05-12 ENCOUNTER — Other Ambulatory Visit (HOSPITAL_COMMUNITY): Payer: Self-pay | Admitting: Family Medicine

## 2012-05-12 DIAGNOSIS — R220 Localized swelling, mass and lump, head: Secondary | ICD-10-CM

## 2012-05-12 DIAGNOSIS — J3489 Other specified disorders of nose and nasal sinuses: Secondary | ICD-10-CM

## 2012-09-24 ENCOUNTER — Ambulatory Visit (INDEPENDENT_AMBULATORY_CARE_PROVIDER_SITE_OTHER): Payer: BC Managed Care – PPO | Admitting: Internal Medicine

## 2012-09-24 ENCOUNTER — Encounter: Payer: Self-pay | Admitting: Internal Medicine

## 2012-09-24 VITALS — BP 130/86 | HR 85 | Ht 60.0 in | Wt 147.8 lb

## 2012-09-24 DIAGNOSIS — J45909 Unspecified asthma, uncomplicated: Secondary | ICD-10-CM

## 2012-09-24 DIAGNOSIS — J4520 Mild intermittent asthma, uncomplicated: Secondary | ICD-10-CM

## 2012-09-24 MED ORDER — MOMETASONE FURO-FORMOTEROL FUM 100-5 MCG/ACT IN AERO: INHALATION_SPRAY | RESPIRATORY_TRACT | Status: DC

## 2012-09-24 NOTE — Progress Notes (Signed)
02/06/12- 21 yoF former smoker -Self Referral-coughing spells; felt like she swallowed something and couldnt breathe-went to ER; also SOB happens about 20 minutes when working out. Reports breathing problems for 6 months especially during exertion such that she has to stop early. Cough and wheeze. Inhaler seems to help. Stress makes it worse. One week ago she had a new "esophageal" pain during stress, not while eating. She said was like something stuck. EKG was okay. Similar episode woke her from sleep and got more intense with shortness of breath over several minutes. Nebulizer at emergency room definitely helped. Some sense of food hang up. Now 3 episodes like this. TUMS antacid does not help. She is tapering prednisone. History of seasonal allergy treated with loratadine. No history of asthma. Pneumonia 6 months ago. She has been on lisinopril and we discussed possible effects of that on the upper airway  Reflux in the past. CXR 02/02/12- IMPRESSION:  Mild peribronchial thickening noted; lungs otherwise clear.  Original Report Authenticated By: Tonia Ghent, M.D.   03/18/12- 68 yoF former smoker followed for dyspnea and cough FOLLOWS FOR: review PFT results with patient; has been using rescue inhaler only prior to working out.  Substernal pain resolved. Notes sinus pressure, post-nasal drip perennial. Was on allergy shots in the past. PFT 03/18/12- Mild obstructive airways disease, small airways, with response to bronchodilator. DLCO mildly reduced. FVC 2.03/ 73%, FEV1 1.50/ 72%, FEV1/FVC 0.74, TLC 90%, DLCO 72%.  09/24/12- 1 yoF former smoker followed for dyspnea and cough. Allergy vaccine at [facility] FOLLOWS FOR:  Having some SOB and wheezing x1 weeks. Denies f/c/s or tightness in chest Wheezing more in the last couple of weeks. Rescue inhaler helps at the gym.   ROS-see HPI Constitutional:   No-   weight loss, night sweats, fevers, chills, fatigue, lassitude. HEENT:   No-  headaches, difficulty  swallowing, tooth/dental problems, sore throat,       No-  sneezing, itching, ear ache, nasal congestion, post nasal drip,  CV:  No-   chest pain, orthopnea, PND, swelling in lower extremities, anasarca, dizziness, palpitations Resp: +  shortness of breath with exertion or at rest.              No-   productive cough,  No- non-productive cough,  No- coughing up of blood.              No-   change in color of mucus.  + wheezing.   Skin: No-   rash or lesions. GI:  No-   heartburn, indigestion, abdominal pain, nausea, vomiting,  GU:  MS:  No-   joint pain or swelling.  Neuro-     nothing unusual Psych:  No- change in mood or affect. No depression or anxiety.  No memory loss.  OBJ- Physical Exam General- Alert, Oriented, Affect-appropriate/ calm , Distress- none acute Skin- rash-none, lesions- none, excoriation- none Lymphadenopathy- none Head- atraumatic            Eyes- Gross vision intact, PERRLA, conjunctivae and secretions clear            Ears- Hearing, canals-normal            Nose- Clear, no-Septal dev, mucus, polyps, erosion, perforation             Throat- Mallampati III , mucosa clear , drainage- none, tonsils- atrophic Neck- flexible , trachea midline, no stridor , thyroid nl, carotid no bruit Chest - symmetrical excursion , unlabored  Heart/CV- RRR , no murmur , no gallop  , no rub, nl s1 s2                           - JVD- none , edema- none, stasis changes- none, varices- none           Lung- clear to P&A, wheezy cough+ , dullness-none, rub- none           Chest wall-  Abd-  Br/ Gen/ Rectal- Not done, not indicated Extrem- cyanosis- none, clubbing, none, atrophy- none, strength- nl Neuro- grossly intact to observation

## 2012-09-24 NOTE — Patient Instructions (Addendum)
Sample and script Dulera 100 maintenance inhaler  2 puffs, then rinse mouth twice daily  You can continue to use your rescue inhaler as needed

## 2012-10-03 NOTE — Assessment & Plan Note (Signed)
Acute exacerbation Plan-add Dulera 100 maintenance controller

## 2012-10-11 ENCOUNTER — Other Ambulatory Visit: Payer: Self-pay | Admitting: Internal Medicine

## 2012-10-12 NOTE — Telephone Encounter (Signed)
diovan last refilled 04/16/12 #90 x 1 refill. Please advise if okay to refill or does this need to go through PCP? Thanks Last OV 09/24/12 with [facility]

## 2012-10-12 NOTE — Telephone Encounter (Signed)
This should go through her PCP for long term BP management, Thanks

## 2012-10-15 ENCOUNTER — Encounter: Payer: BC Managed Care – PPO | Admitting: Women's Health

## 2012-10-18 ENCOUNTER — Other Ambulatory Visit: Payer: Self-pay | Admitting: Internal Medicine

## 2012-10-19 NOTE — Telephone Encounter (Signed)
If this was our prescription, then ok to refill

## 2012-10-19 NOTE — Telephone Encounter (Signed)
Please advise if okay to give refill or go through patients PCP.

## 2012-11-05 ENCOUNTER — Other Ambulatory Visit (HOSPITAL_COMMUNITY)
Admission: RE | Admit: 2012-11-05 | Discharge: 2012-11-05 | Disposition: A | Discharge status: Home / Self Care | Payer: BC Managed Care – PPO | Source: Ambulatory Visit | Attending: Gynecology | Admitting: Gynecology

## 2012-11-05 ENCOUNTER — Ambulatory Visit (INDEPENDENT_AMBULATORY_CARE_PROVIDER_SITE_OTHER): Payer: BC Managed Care – PPO | Admitting: Women's Health

## 2012-11-05 ENCOUNTER — Encounter: Payer: Self-pay | Admitting: Women's Health

## 2012-11-05 VITALS — BP 126/80 | Ht 59.25 in | Wt 146.0 lb

## 2012-11-05 DIAGNOSIS — Z01419 Encounter for gynecological examination (general) (routine) without abnormal findings: Secondary | ICD-10-CM

## 2012-11-05 NOTE — Progress Notes (Signed)
Carla Cline 04-Apr-1960 [id number]    History:    The patient presents for annual exam.  2013 cycles every 2-3 months, no cycle for 6 months, last cycle last month. 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 Normal Pap and mammogram history. Benign colon polyp 2011. Primary care manages hypertension/hypercholesterolemia/asthma. Mother hypertension, breast cancer in her 64s BRCA status unknown.    Past medical history, past surgical history, family history and social history were all reviewed and documented in the EPIC chart. Works at [employer]. [name] doing well, [name] in college  doing okay. Father died of heart disease age 69.   ROS:  A  ROS was performed and pertinent positives and negatives are included in the history.  Exam:  Filed Vitals:   11/05/12 1511  BP: 126/80    General appearance:  Normal Head/Neck:  Normal, without cervical or supraclavicular adenopathy. Thyroid:  Symmetrical, normal in size, without palpable masses or nodularity. Respiratory  Effort:  Normal  Auscultation:  Clear without wheezing or rhonchi Cardiovascular  Auscultation:  Regular rate, without rubs, murmurs or gallops  Edema/varicosities:  Not grossly evident Abdominal  Soft,nontender, without masses, guarding or rebound.  Liver/spleen:  No organomegaly noted  Hernia:  None appreciated  Skin  Inspection:  Grossly normal  Palpation:  Grossly normal Neurologic/psychiatric  Orientation:  Normal with appropriate conversation.  Mood/affect:  Normal  Genitourinary    Breasts: Examined lying and sitting.     Right: Without masses, retractions, discharge or axillary adenopathy.     Left: Without masses, retractions, discharge or axillary adenopathy.   Inguinal/mons:  Normal without inguinal adenopathy  External genitalia:  Normal  BUS/Urethra/Skene's glands:  Normal  Bladder:  Normal  Vagina:  Normal  Cervix:  Normal  Uterus:   normal in size, shape and contour.  Midline and  mobile  Adnexa/parametria:     Rt: Without masses or tenderness.   Lt: Without masses or tenderness.  Anus and perineum: Normal  Digital rectal exam: Normal sphincter tone without palpated masses or tenderness  Assessment/Plan:  52 y.o. MWF G1P1+1 adopted for annual exam with no complaints.  Perimenopausal Hypertension/hypercholesterolemia/asthma-primary care manages labs and meds  Plan: Menopause reviewed, minimal symptoms currently. SBE's, continue annual mammogram, overdue instructed to schedule. Calcium rich diet, vitamin D 2000 daily, continue regular exercise and healthy lifestyle. Pap, Pap normal 2012, new screening guidelines reviewed. Harrington Challenger [facility], 3:58 PM 11/05/2012

## 2012-11-05 NOTE — Patient Instructions (Addendum)
Health Recommendations for Postmenopausal Women Respected and ongoing research has looked at the most common causes of death, disability, and poor quality of life in postmenopausal women. The causes include heart disease, diseases of blood vessels, diabetes, depression, cancer, and bone loss (osteoporosis). Many things can be done to help lower the chances of developing these and other common problems: CARDIOVASCULAR DISEASE Heart Disease: A heart attack is a medical emergency. Know the signs and symptoms of a heart attack. Below are things women can do to reduce their risk for heart disease.   Do not smoke. If you smoke, quit.  Aim for a healthy weight. Being overweight causes many preventable deaths. Eat a healthy and balanced diet and drink an adequate amount of liquids.  Get moving. Make a commitment to be more physically active. Aim for 30 minutes of activity on most, if not all days of the week.  Eat for heart health. Choose a diet that is low in saturated fat and cholesterol and eliminate trans fat. Include whole grains, vegetables, and fruits. Read and understand the labels on food containers before buying.  Know your numbers. Ask your caregiver to check your blood pressure, cholesterol (total, HDL, LDL, triglycerides) and blood glucose. Work with your caregiver on improving your entire clinical picture.  High blood pressure. Limit or stop your table salt intake (try salt substitute and food seasonings). Avoid salty foods and drinks. Read labels on food containers before buying. Eating well and exercising can help control high blood pressure. STROKE  Stroke is a medical emergency. Stroke may be the result of a blood clot in a blood vessel in the brain or by a brain hemorrhage (bleeding). Know the signs and symptoms of a stroke. To lower the risk of developing a stroke:  Avoid fatty foods.  Quit smoking.  Control your diabetes, blood pressure, and irregular heart rate. THROMBOPHLEBITIS  (BLOOD CLOT) OF THE LEG  Becoming overweight and leading a stationary lifestyle may also contribute to developing blood clots. Controlling your diet and exercising will help lower the risk of developing blood clots. CANCER SCREENING  Breast Cancer: Take steps to reduce your risk of breast cancer.  You should practice "breast self-awareness." This means understanding the normal appearance and feel of your breasts and should include breast self-examination. Any changes detected, no matter how small, should be reported to your caregiver.  After age 40, you should have a clinical breast exam (CBE) every year.  Starting at age 40, you should consider having a mammogram (breast X-ray) every year.  If you have a family history of breast cancer, talk to your caregiver about genetic screening.  If you are at high risk for breast cancer, talk to your caregiver about having an MRI and a mammogram every year.  Intestinal or Stomach Cancer: Tests to consider are a rectal exam, fecal occult blood, sigmoidoscopy, and colonoscopy. Women who are high risk may need to be screened at an earlier age and more often.  Cervical Cancer:  Beginning at age 30, you should have a Pap test every 3 years as long as the past 3 Pap tests have been normal.  If you have had past treatment for cervical cancer or a condition that could lead to cancer, you need Pap tests and screening for cancer for at least 20 years after your treatment.  If you had a hysterectomy for a problem that was not cancer or a condition that could lead to cancer, then you no longer need Pap tests.    If you are between ages 65 and 70, and you have had normal Pap tests going back 10 years, you no longer need Pap tests.  If Pap tests have been discontinued, risk factors (such as a new sexual partner) need to be reassessed to determine if screening should be resumed.  Some medical problems can increase the chance of getting cervical cancer. In these  cases, your caregiver may recommend more frequent screening and Pap tests.  Uterine Cancer: If you have vaginal bleeding after reaching menopause, you should notify your caregiver.  Ovarian cancer: Other than yearly pelvic exams, there are no reliable tests available to screen for ovarian cancer at this time except for yearly pelvic exams.  Lung Cancer: Yearly chest X-rays can detect lung cancer and should be done on high risk women, such as cigarette smokers and women with chronic lung disease (emphysema).  Skin Cancer: A complete body skin exam should be done at your yearly examination. Avoid overexposure to the sun and ultraviolet light lamps. Use a strong sun block cream when in the sun. All of these things are important in lowering the risk of skin cancer. MENOPAUSE Menopause Symptoms: Hormone therapy products are effective for treating symptoms associated with menopause:  Moderate to severe hot flashes.  Night sweats.  Mood swings.  Headaches.  Tiredness.  Loss of sex drive.  Insomnia.  Other symptoms. Hormone replacement carries certain risks, especially in older women. Women who use or are thinking about using estrogen or estrogen with progestin treatments should discuss that with their caregiver. Your caregiver will help you understand the benefits and risks. The ideal dose of hormone replacement therapy is not known. The Food and Drug Administration (FDA) has concluded that hormone therapy should be used only at the lowest doses and for the shortest amount of time to reach treatment goals.  OSTEOPOROSIS Protecting Against Bone Loss and Preventing Fracture: If you use hormone therapy for prevention of bone loss (osteoporosis), the risks for bone loss must outweigh the risk of the therapy. Ask your caregiver about other medications known to be safe and effective for preventing bone loss and fractures. To guard against bone loss or fractures, the following is recommended:  If  you are less than age 50, take 1000 mg of calcium and at least 600 mg of Vitamin D per day.  If you are greater than age 50 but less than age 70, take 1200 mg of calcium and at least 600 mg of Vitamin D per day.  If you are greater than age 70, take 1200 mg of calcium and at least 800 mg of Vitamin D per day. Smoking and excessive alcohol intake increases the risk of osteoporosis. Eat foods rich in calcium and vitamin D and do weight bearing exercises several times a week as your caregiver suggests. DIABETES Diabetes Melitus: If you have Type I or Type 2 diabetes, you should keep your blood sugar under control with diet, exercise and recommended medication. Avoid too many sweets, starchy and fatty foods. Being overweight can make control more difficult. COGNITION AND MEMORY Cognition and Memory: Menopausal hormone therapy is not recommended for the prevention of cognitive disorders such as Alzheimer's disease or memory loss.  DEPRESSION  Depression may occur at any age, but is common in elderly women. The reasons may be because of physical, medical, social (loneliness), or financial problems and needs. If you are experiencing depression because of medical problems and control of symptoms, talk to your caregiver about this. Physical activity and   exercise may help with mood and sleep. Community and volunteer involvement may help your sense of value and worth. If you have depression and you feel that the problem is getting worse or becoming severe, talk to your caregiver about treatment options that are best for you. ACCIDENTS  Accidents are common and can be serious in the elderly woman. Prepare your house to prevent accidents. Eliminate throw rugs, place hand bars in the bath, shower and toilet areas. Avoid wearing high heeled shoes or walking on wet, snowy, and icy areas. Limit or stop driving if you have vision or hearing problems, or you feel you are unsteady with you movements and  reflexes. HEPATITIS C Hepatitis C is a type of viral infection affecting the liver. It is spread mainly through contact with blood from an infected person. It can be treated, but if left untreated, it can lead to severe liver damage over years. Many people who are infected do not know that the virus is in their blood. If you are a "baby-boomer", it is recommended that you have one screening test for Hepatitis C. IMMUNIZATIONS  Several immunizations are important to consider having during your senior years, including:   Tetanus, diptheria, and pertussis booster shot.  Influenza every year before the flu season begins.  Pneumonia vaccine.  Shingles vaccine.  Others as indicated based on your specific needs. Talk to your caregiver about these. Document Released: 02/21/2005 Document Revised: 12/17/2011 Document Reviewed: 10/18/2007 [facility] Patient Information 2014 [place], [place].  Vit D 2000 daily

## 2012-11-19 ENCOUNTER — Ambulatory Visit: Payer: BC Managed Care – PPO | Admitting: Internal Medicine

## 2013-02-26 ENCOUNTER — Other Ambulatory Visit: Payer: Self-pay | Admitting: Internal Medicine

## 2013-02-28 NOTE — Telephone Encounter (Signed)
Please advise if okay to refill or go through PCP. Thanks.

## 2013-02-28 NOTE — Telephone Encounter (Signed)
Ok to refill until next ov

## 2013-03-02 NOTE — Telephone Encounter (Signed)
I had okay'd this earlier

## 2013-04-19 ENCOUNTER — Encounter: Payer: Self-pay | Admitting: Women's Health

## 2013-04-19 ENCOUNTER — Telehealth: Payer: Self-pay | Admitting: *Deleted

## 2013-04-19 ENCOUNTER — Ambulatory Visit (INDEPENDENT_AMBULATORY_CARE_PROVIDER_SITE_OTHER): Payer: BC Managed Care – PPO | Admitting: Women's Health

## 2013-04-19 DIAGNOSIS — N63 Unspecified lump in unspecified breast: Secondary | ICD-10-CM

## 2013-04-19 DIAGNOSIS — R928 Other abnormal and inconclusive findings on diagnostic imaging of breast: Secondary | ICD-10-CM

## 2013-04-19 NOTE — Progress Notes (Signed)
Patient ID: Carla Cline, female   DOB: [birth year], 53 y.o.   MRN: [id number] Presents with complaint of questionable left breast nodule with tenderness at times. Recently had several skin tags removed questionable nodule noted by dermatologist. Normal mammogram history, last mammogram 05/2011. Mother history of breast cancer in her 93s living. Has had a nontender 3 cm sebaceous cyst in left axilla for many years.   Exam: Appears well. Breast exam sitting and  lying position, no visible retractions, dimpling. Questionable palpable 1 cm mobile slightly tender nodule outer quadrant of left breast at 3:00 position. No palpable nodules in right breast.  Questionable left breast nodule  Plan: Diagnostic mammogram, will get scheduled.

## 2013-04-19 NOTE — Telephone Encounter (Signed)
Orders placed at breast center they will contact patient.

## 2013-04-19 NOTE — Telephone Encounter (Signed)
Message copied by Thamas Jaegers on Tue Apr 19, 2013  4:22 PM ------      Message from: [place], [place]      Created: Tue Apr 19, 2013  3:58 PM       Needs a diagnostic mammogram, questionable 2cm nodule outer quad of left breast, questionable bruise,  Has had mammograms in the past at breast center at [facility].  Mother with breast cancer in her 53's. ------

## 2013-05-02 NOTE — Telephone Encounter (Signed)
Appointment 05/05/13 @ 2:15 pm

## 2013-05-05 ENCOUNTER — Ambulatory Visit
Admission: RE | Admit: 2013-05-05 | Discharge: 2013-05-05 | Disposition: A | Discharge status: Home / Self Care | Payer: BC Managed Care – PPO | Source: Ambulatory Visit | Attending: Women's Health | Admitting: Women's Health

## 2013-05-05 DIAGNOSIS — N63 Unspecified lump in unspecified breast: Secondary | ICD-10-CM

## 2013-07-10 ENCOUNTER — Other Ambulatory Visit: Payer: Self-pay | Admitting: Internal Medicine

## 2013-07-11 NOTE — Telephone Encounter (Signed)
02-06-12 OV pt instructions: Script for Diovan- For one month, take this instead of Lisinopril. See if there is any impact on breathing or on these episodes of mid-chest pain   CY, Please advise if you want to continue filling this BP med for patient or have her go through her PCP from now on. Thanks.    Pt last seen 09-24-12 and told to follow up 6 weeks later; no pending appts.

## 2013-07-11 NOTE — Telephone Encounter (Signed)
We had her try Diovan instead of lisinopril in Jan, 2014 to see if airway irritability would improve.  If she does better with Diovan, then she should be getting this or something else ongoing from the doctor who manages her BP. She should just avoid ACE inhibitors like lisinopril.

## 2013-07-20 ENCOUNTER — Telehealth: Payer: Self-pay | Admitting: Internal Medicine

## 2013-07-20 ENCOUNTER — Other Ambulatory Visit: Payer: Self-pay | Admitting: Internal Medicine

## 2013-07-20 NOTE — Telephone Encounter (Signed)
We gave her Diovan to get her off an ACE inhibitor because of side effects. I shouldn't be continuing Diovan. That needs to be done now through her primary physician.

## 2013-07-20 NOTE — Telephone Encounter (Signed)
CY, Please advise if okay to refill or have patient go through her PCP for this medication from now on. Thanks.

## 2013-07-20 NOTE — Telephone Encounter (Signed)
Pt calling with question about her Diovan denial Advised pt CY denied this medication because it was only rx'd to replace her Lisinopril - will need to be refilled by her PCP in the future Pt okay with this, voiced her understanding and denied any questions/concerns at this time Will sign off.  Per 7.8.15 refill: Deneise Lever, MD at 07/20/2013 3:26 PM     We gave her Diovan to get her off an ACE inhibitor because of side effects. I shouldn't be continuing Diovan. That needs to be done now through her primary physician.

## 2013-07-23 ENCOUNTER — Other Ambulatory Visit: Payer: Self-pay | Admitting: Internal Medicine

## 2013-11-08 ENCOUNTER — Encounter: Payer: Self-pay | Admitting: Women's Health

## 2013-11-08 ENCOUNTER — Ambulatory Visit (INDEPENDENT_AMBULATORY_CARE_PROVIDER_SITE_OTHER): Payer: BC Managed Care – PPO | Admitting: Women's Health

## 2013-11-08 VITALS — BP 140/80 | Ht 60.0 in | Wt 142.0 lb

## 2013-11-08 DIAGNOSIS — Z01419 Encounter for gynecological examination (general) (routine) without abnormal findings: Secondary | ICD-10-CM

## 2013-11-08 DIAGNOSIS — N952 Postmenopausal atrophic vaginitis: Secondary | ICD-10-CM

## 2013-11-08 MED ORDER — ESTRADIOL 10 MCG VA TABS: ORAL_TABLET | VAGINAL | Status: DC

## 2013-11-08 NOTE — Patient Instructions (Signed)
Health Recommendations for Postmenopausal Women Respected and ongoing research has looked at the most common causes of death, disability, and poor quality of life in postmenopausal women. The causes include heart disease, diseases of blood vessels, diabetes, depression, cancer, and bone loss (osteoporosis). Many things can be done to help lower the chances of developing these and other common problems. CARDIOVASCULAR DISEASE Heart Disease: A heart attack is a medical emergency. Know the signs and symptoms of a heart attack. Below are things women can do to reduce their risk for heart disease.   Do not smoke. If you smoke, quit.  Aim for a healthy weight. Being overweight causes many preventable deaths. Eat a healthy and balanced diet and drink an adequate amount of liquids.  Get moving. Make a commitment to be more physically active. Aim for 30 minutes of activity on most, if not all days of the week.  Eat for heart health. Choose a diet that is low in saturated fat and cholesterol and eliminate trans fat. Include whole grains, vegetables, and fruits. Read and understand the labels on food containers before buying.  Know your numbers. Ask your caregiver to check your blood pressure, cholesterol (total, HDL, LDL, triglycerides) and blood glucose. Work with your caregiver on improving your entire clinical picture.  High blood pressure. Limit or stop your table salt intake (try salt substitute and food seasonings). Avoid salty foods and drinks. Read labels on food containers before buying. Eating well and exercising can help control high blood pressure. STROKE  Stroke is a medical emergency. Stroke may be the result of a blood clot in a blood vessel in the brain or by a brain hemorrhage (bleeding). Know the signs and symptoms of a stroke. To lower the risk of developing a stroke:  Avoid fatty foods.  Quit smoking.  Control your diabetes, blood pressure, and irregular heart rate. THROMBOPHLEBITIS  (BLOOD CLOT) OF THE LEG  Becoming overweight and leading a stationary lifestyle may also contribute to developing blood clots. Controlling your diet and exercising will help lower the risk of developing blood clots. CANCER SCREENING  Breast Cancer: Take steps to reduce your risk of breast cancer.  You should practice "breast self-awareness." This means understanding the normal appearance and feel of your breasts and should include breast self-examination. Any changes detected, no matter how small, should be reported to your caregiver.  After age 40, you should have a clinical breast exam (CBE) every year.  Starting at age 40, you should consider having a mammogram (breast X-ray) every year.  If you have a family history of breast cancer, talk to your caregiver about genetic screening.  If you are at high risk for breast cancer, talk to your caregiver about having an MRI and a mammogram every year.  Intestinal or Stomach Cancer: Tests to consider are a rectal exam, fecal occult blood, sigmoidoscopy, and colonoscopy. Women who are high risk may need to be screened at an earlier age and more often.  Cervical Cancer:  Beginning at age 30, you should have a Pap test every 3 years as long as the past 3 Pap tests have been normal.  If you have had past treatment for cervical cancer or a condition that could lead to cancer, you need Pap tests and screening for cancer for at least 20 years after your treatment.  If you had a hysterectomy for a problem that was not cancer or a condition that could lead to cancer, then you no longer need Pap tests.    If you are between ages 65 and 70, and you have had normal Pap tests going back 10 years, you no longer need Pap tests.  If Pap tests have been discontinued, risk factors (such as a new sexual partner) need to be reassessed to determine if screening should be resumed.  Some medical problems can increase the chance of getting cervical cancer. In these  cases, your caregiver may recommend more frequent screening and Pap tests.  Uterine Cancer: If you have vaginal bleeding after reaching menopause, you should notify your caregiver.  Ovarian Cancer: Other than yearly pelvic exams, there are no reliable tests available to screen for ovarian cancer at this time except for yearly pelvic exams.  Lung Cancer: Yearly chest X-rays can detect lung cancer and should be done on high risk women, such as cigarette smokers and women with chronic lung disease (emphysema).  Skin Cancer: A complete body skin exam should be done at your yearly examination. Avoid overexposure to the sun and ultraviolet light lamps. Use a strong sun block cream when in the sun. All of these things are important for lowering the risk of skin cancer. MENOPAUSE Menopause Symptoms: Hormone therapy products are effective for treating symptoms associated with menopause:  Moderate to severe hot flashes.  Night sweats.  Mood swings.  Headaches.  Tiredness.  Loss of sex drive.  Insomnia.  Other symptoms. Hormone replacement carries certain risks, especially in older women. Women who use or are thinking about using estrogen or estrogen with progestin treatments should discuss that with their caregiver. Your caregiver will help you understand the benefits and risks. The ideal dose of hormone replacement therapy is not known. The Food and Drug Administration (FDA) has concluded that hormone therapy should be used only at the lowest doses and for the shortest amount of time to reach treatment goals.  OSTEOPOROSIS Protecting Against Bone Loss and Preventing Fracture If you use hormone therapy for prevention of bone loss (osteoporosis), the risks for bone loss must outweigh the risk of the therapy. Ask your caregiver about other medications known to be safe and effective for preventing bone loss and fractures. To guard against bone loss or fractures, the following is recommended:  If  you are younger than age 50, take 1000 mg of calcium and at least 600 mg of Vitamin D per day.  If you are older than age 50 but younger than age 70, take 1200 mg of calcium and at least 600 mg of Vitamin D per day.  If you are older than age 70, take 1200 mg of calcium and at least 800 mg of Vitamin D per day. Smoking and excessive alcohol intake increases the risk of osteoporosis. Eat foods rich in calcium and vitamin D and do weight bearing exercises several times a week as your caregiver suggests. DIABETES Diabetes Mellitus: If you have type I or type 2 diabetes, you should keep your blood sugar under control with diet, exercise, and recommended medication. Avoid starchy and fatty foods, and too many sweets. Being overweight can make diabetes control more difficult. COGNITION AND MEMORY Cognition and Memory: Menopausal hormone therapy is not recommended for the prevention of cognitive disorders such as Alzheimer's disease or memory loss.  DEPRESSION  Depression may occur at any age, but it is common in elderly women. This may be because of physical, medical, social (loneliness), or financial problems and needs. If you are experiencing depression because of medical problems and control of symptoms, talk to your caregiver about this. Physical   activity and exercise may help with mood and sleep. Community and volunteer involvement may improve your sense of value and worth. If you have depression and you feel that the problem is getting worse or becoming severe, talk to your caregiver about which treatment options are best for you. ACCIDENTS  Accidents are common and can be serious in elderly woman. Prepare your house to prevent accidents. Eliminate throw rugs, place hand bars in bath, shower, and toilet areas. Avoid wearing high heeled shoes or walking on wet, snowy, and icy areas. Limit or stop driving if you have vision or hearing problems, or if you feel you are unsteady with your movements and  reflexes. HEPATITIS C Hepatitis C is a type of viral infection affecting the liver. It is spread mainly through contact with blood from an infected person. It can be treated, but if left untreated, it can lead to severe liver damage over the years. Many people who are infected do not know that the virus is in their blood. If you are a "baby-boomer", it is recommended that you have one screening test for Hepatitis C. IMMUNIZATIONS  Several immunizations are important to consider having during your senior years, including:   Tetanus, diphtheria, and pertussis booster shot.  Influenza every year before the flu season begins.  Pneumonia vaccine.  Shingles vaccine.  Others, as indicated based on your specific needs. Talk to your caregiver about these. Document Released: 02/21/2005 Document Revised: 05/16/2013 Document Reviewed: 10/18/2007 [employer] Patient Information 2015 [employer]. This information is not intended to replace advice given to you by your health care provider. Make sure you discuss any questions you have with your health care provider.  

## 2013-11-08 NOTE — Progress Notes (Signed)
Carla Cline Oct 29, 1960 [id number]    History:    Presents for annual exam.  Postmenopausal/no bleeding/no HRT with complaint of vaginal dryness. Normal Pap and mammogram history. Benign colon polyp 2011. Hypertension/hypercholesterolemia/asthma primary care manages.  Past medical history, past surgical history, family history and social history were all reviewed and documented in the EPIC chart. Works at [employer].  2 children both doing well. Father died age 75 from heart disease. Mother hypertension, breast cancer in her 21s, survivor.  ROS:  A  12 point ROS was performed and pertinent positives and negatives are included.  Exam:  Filed Vitals:   11/08/13 1537  BP: 140/80    General appearance:  Normal Thyroid:  Symmetrical, normal in size, without palpable masses or nodularity. Respiratory  Auscultation:  Clear without wheezing or rhonchi Cardiovascular  Auscultation:  Regular rate, without rubs, murmurs or gallops  Edema/varicosities:  Not grossly evident Abdominal  Soft,nontender, without masses, guarding or rebound.  Liver/spleen:  No organomegaly noted  Hernia:  None appreciated  Skin  Inspection:  Grossly normal   Breasts: Examined lying and sitting.     Right: Without masses, retractions, discharge or axillary adenopathy.     Left: Without masses, retractions, discharge or axillary adenopathy. Gentitourinary   Inguinal/mons:  Normal without inguinal adenopathy  External genitalia:  Normal  BUS/Urethra/Skene's glands:  Normal  Vagina:  Mild atrophy  Cervix:  Normal  Uterus:   normal in size, shape and contour.  Midline and mobile  Adnexa/parametria:     Rt: Without masses or tenderness.   Lt: Without masses or tenderness.  Anus and perineum: Normal  Digital rectal exam: Normal sphincter tone without palpated masses or tenderness  Assessment/Plan:  52 y.o. MWF G1P1 + adopted son for annual exam with complaint of vaginal dryness/dyspareunia.  Postmenopausal  one year/no bleeding/no HRT with vaginal dryness Hypertension/hypercholesterolemia/asthma-primary care manages labs and meds  Plan: Options for vaginal atrophy reviewed will try Vagifem 1 applicator at bedtime at night for 2 weeks and then twice weekly thereafter. Prescription, proper use, reviewed minimal systemic absorption, and continue vaginal lubricants with intercourse. Instructed to call if no relief. SBE's, continue annual mammogram, 3-D tomography reviewed and encouraged, continue regular exercise, calcium rich diet, vitamin D 1000 daily. Instructed to have vitamin D checked at annual exam with primary care.  Pap normal 2014, new screening guidelines reviewed. Bone density next year.   [employer], 5:56 PM 11/08/2013

## 2013-11-09 LAB — URINALYSIS W MICROSCOPIC + REFLEX CULTURE
BACTERIA UA: NONE SEEN
BILIRUBIN URINE: NEGATIVE
Casts: NONE SEEN
GLUCOSE, UA: NEGATIVE mg/dL
HGB URINE DIPSTICK: NEGATIVE
Ketones, ur: NEGATIVE mg/dL
Leukocytes, UA: NEGATIVE
Nitrite: NEGATIVE
Protein, ur: NEGATIVE mg/dL
SQUAMOUS EPITHELIAL / LPF: NONE SEEN
Specific Gravity, Urine: 1.016 (ref 1.005–1.030)
Urobilinogen, UA: 0.2 mg/dL (ref 0.0–1.0)
pH: 6.5 (ref 5.0–8.0)

## 2013-11-14 ENCOUNTER — Encounter: Payer: Self-pay | Admitting: Women's Health

## 2014-01-26 IMAGING — CR DG CHEST 2V
2 series · 2 of 2 positions shown · non-contrast
Comparison: Chest radiograph performed 03/28/2003

CLINICAL DATA: Shortness of breath.  Near-syncope.

CHEST - 2 VIEW

[w chest pa]
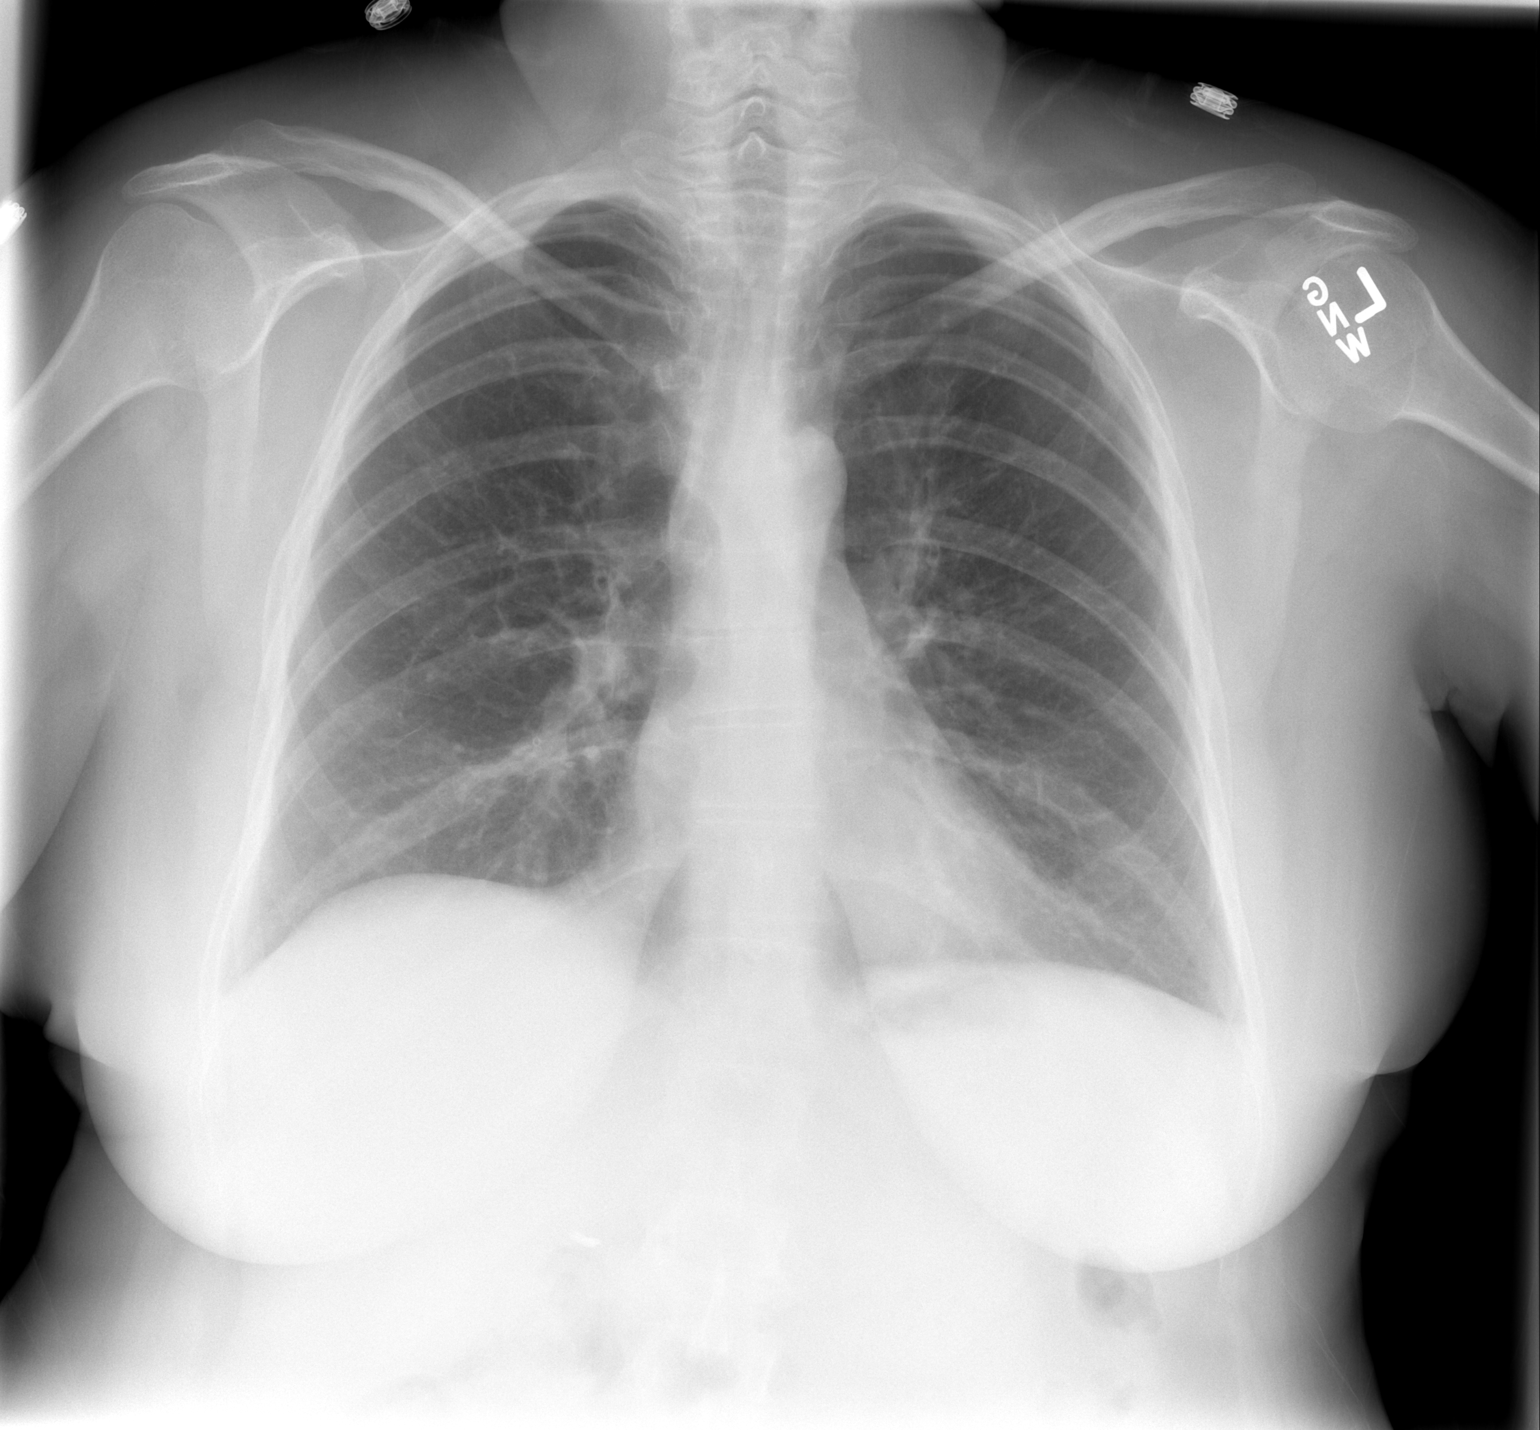

[w chest lat]
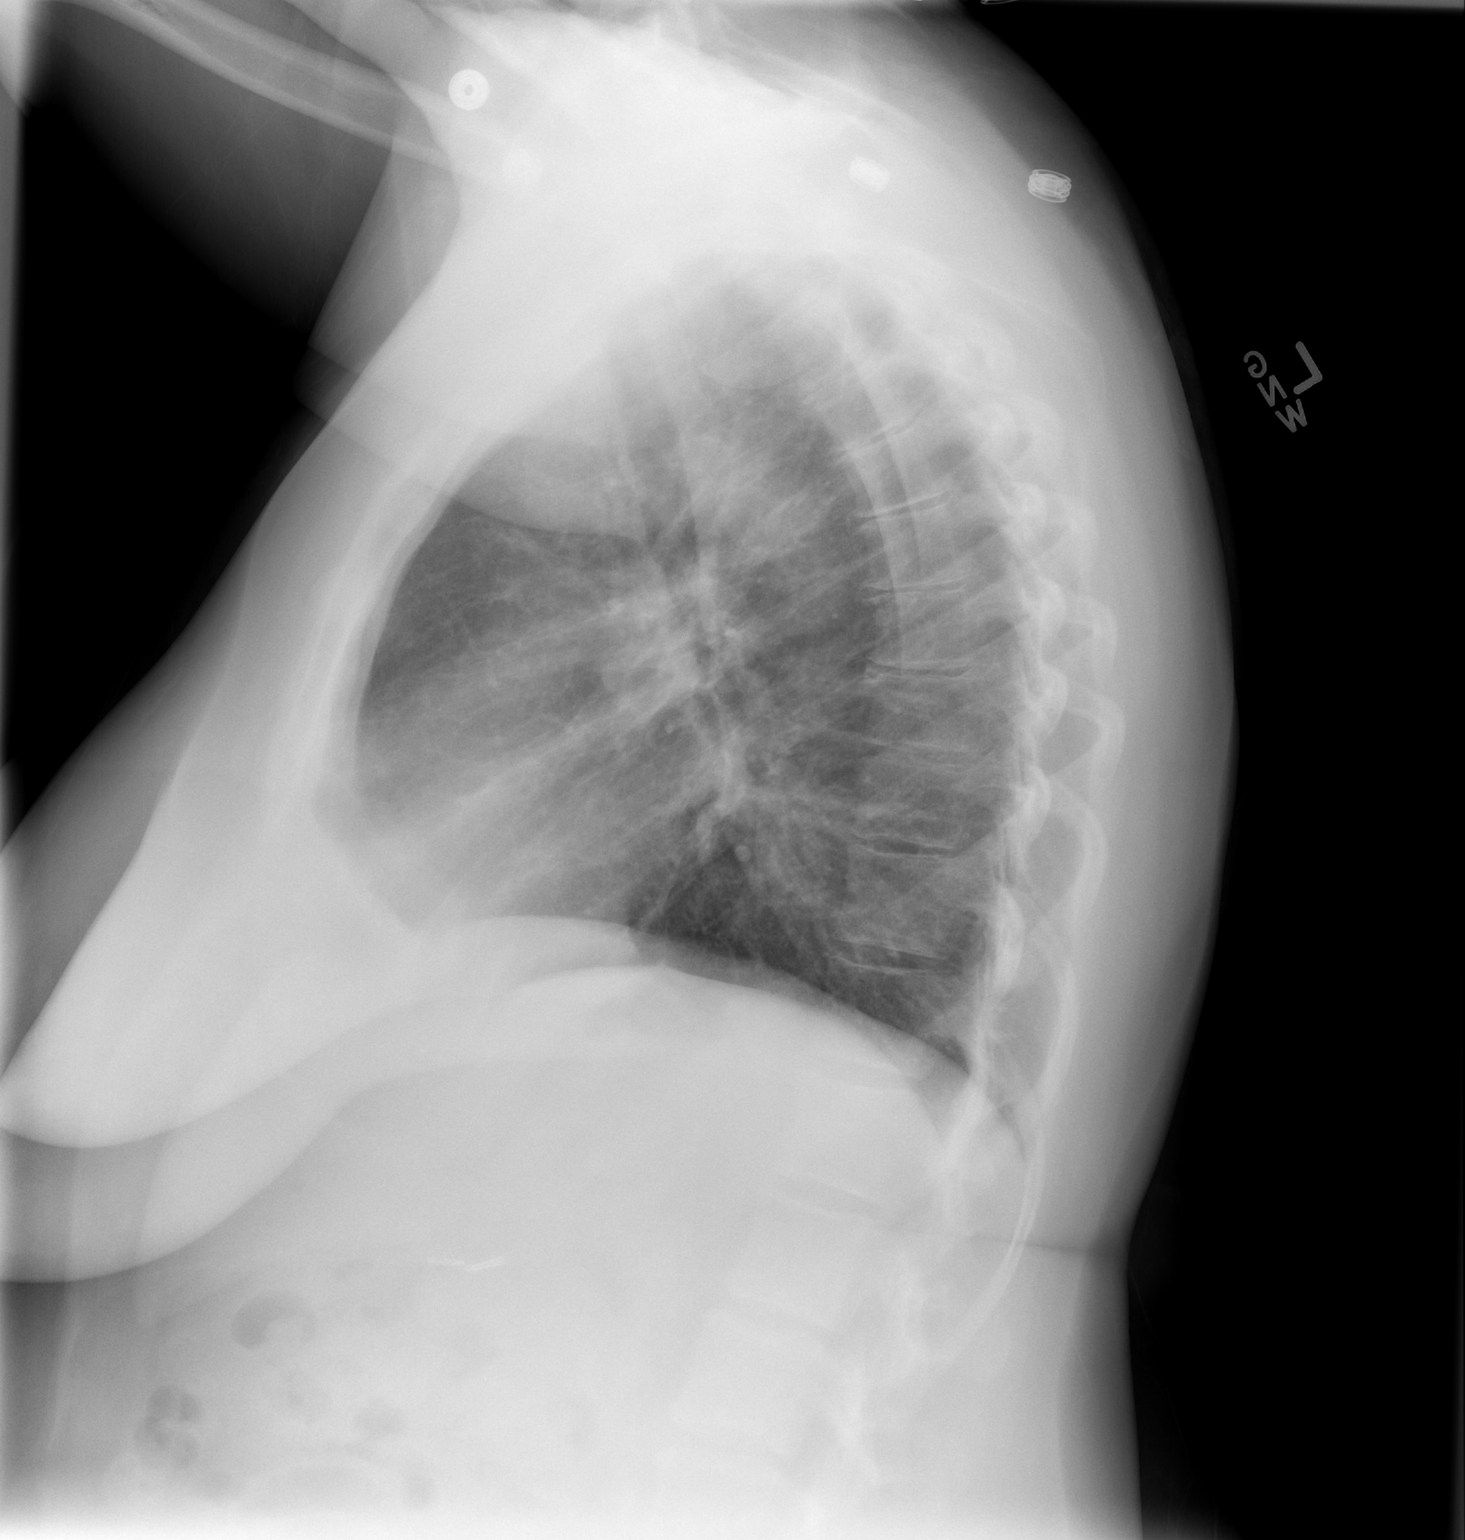

[2 of 2 positions shown; findings below may reference images not displayed]

FINDINGS: The lungs are well-aerated.  Mild peribronchial
thickening is noted.  There is no evidence of focal opacification,
pleural effusion or pneumothorax.

The heart is normal in size; the mediastinal contour is within
normal limits.  No acute osseous abnormalities are seen.  Clips are
noted within the right upper quadrant, reflecting prior
cholecystectomy.
IMPRESSION: Mild peribronchial thickening noted; lungs otherwise clear.

## 2014-02-18 ENCOUNTER — Other Ambulatory Visit: Payer: Self-pay | Admitting: Internal Medicine

## 2014-04-09 ENCOUNTER — Other Ambulatory Visit: Payer: Self-pay | Admitting: Internal Medicine

## 2014-04-11 ENCOUNTER — Telehealth: Payer: Self-pay | Admitting: Internal Medicine

## 2014-04-11 MED ORDER — ALBUTEROL SULFATE HFA 108 (90 BASE) MCG/ACT IN AERS: INHALATION_SPRAY | RESPIRATORY_TRACT | Status: DC

## 2014-04-11 NOTE — Telephone Encounter (Signed)
Spoke with pt and advised that Proventil refill was sent to pharmacy.  Pt has ov with Dr Gwenette Greet 05/03/14.

## 2014-04-19 ENCOUNTER — Telehealth: Payer: Self-pay | Admitting: Internal Medicine

## 2014-04-19 NOTE — Telephone Encounter (Signed)
Dr Juventino Slovak, company doc at [place] having 2 weeks increased cough, heavy use of albuterol inhaler. Plan- Recommend he give prednisone taper and we will see her as planned later this month.Marland Kitchen

## 2014-05-03 ENCOUNTER — Ambulatory Visit (INDEPENDENT_AMBULATORY_CARE_PROVIDER_SITE_OTHER): Payer: BLUE CROSS/BLUE SHIELD | Admitting: Internal Medicine

## 2014-05-03 ENCOUNTER — Encounter: Payer: Self-pay | Admitting: Internal Medicine

## 2014-05-03 VITALS — BP 120/76 | HR 58 | Ht 60.0 in | Wt 139.2 lb

## 2014-05-03 DIAGNOSIS — J4520 Mild intermittent asthma, uncomplicated: Secondary | ICD-10-CM

## 2014-05-03 MED ORDER — MOMETASONE FURO-FORMOTEROL FUM 100-5 MCG/ACT IN AERO: 2.0000 | INHALATION_SPRAY | Freq: Two times a day (BID) | RESPIRATORY_TRACT | Status: DC

## 2014-05-03 MED ORDER — AZELASTINE-FLUTICASONE 137-50 MCG/ACT NA SUSP: 2.0000 | Freq: Every day | NASAL | Status: DC

## 2014-05-03 MED ORDER — MOMETASONE FURO-FORMOTEROL FUM 100-5 MCG/ACT IN AERO: INHALATION_SPRAY | RESPIRATORY_TRACT | Status: DC

## 2014-05-03 NOTE — Patient Instructions (Signed)
Sample Dymista nasal spray   1-2 puffs each nostril once daily at bedtime  If Dymista isn't helpful, then try otc Nasalcrom/ cromolyn/ cromol  Sample and script to try Dulera 100 maintenance inhaler 2 puffs then rinse mouth, twice daily  You can still use the albuterol rescue inhaler if needed

## 2014-05-03 NOTE — Progress Notes (Signed)
02/06/12- 47 yoF former smoker -Self Referral-coughing spells; felt like she swallowed something and couldnt breathe-went to ER; also SOB happens about 20 minutes when working out. Reports breathing problems for 6 months especially during exertion such that she has to stop early. Cough and wheeze. Inhaler seems to help. Stress makes it worse. One week ago she had a new "esophageal" pain during stress, not while eating. She said was like something stuck. EKG was okay. Similar episode woke her from sleep and got more intense with shortness of breath over several minutes. Nebulizer at emergency room definitely helped. Some sense of food hang up. Now 3 episodes like this. TUMS antacid does not help. She is tapering prednisone. History of seasonal allergy treated with loratadine. No history of asthma. Pneumonia 6 months ago. She has been on lisinopril and we discussed possible effects of that on the upper airway  Reflux in the past. CXR 02/02/12- IMPRESSION:  Mild peribronchial thickening noted; lungs otherwise clear.  Original Report Authenticated By: Santa Lighter, M.D.   03/18/12- 49 yoF former smoker followed for dyspnea and cough FOLLOWS FOR: review PFT results with patient; has been using rescue inhaler only prior to working out.  Substernal pain resolved. Notes sinus pressure, post-nasal drip perennial. Was on allergy shots in the past. PFT 03/18/12- Mild obstructive airways disease, small airways, with response to bronchodilator. DLCO mildly reduced. FVC 2.03/ 73%, FEV1 1.50/ 72%, FEV1/FVC 0.74, TLC 90%, DLCO 72%.  09/24/12- 1 yoF former smoker followed for dyspnea and cough. Allergy vaccine at [facility] FOLLOWS FOR:  Having some SOB and wheezing x1 weeks. Denies f/c/s or tightness in chest Wheezing more in the last couple of weeks. Rescue inhaler helps at the gym.   05/03/14- 78 yoF former smoker followed for dyspnea and cough. Allergy vaccine at [facility] FOLLOWS FOR: follow up at MD request due to flare  ups of asthma Allergy Profile 02/06/12- Neg total IgE 41.3  CXR 02/02/12 IMPRESSION: Mild peribronchial thickening noted; lungs otherwise clear. Original Report Authenticated By: Santa Lighter, M.D.  ROS-see HPI Constitutional:   No-   weight loss, night sweats, fevers, chills, fatigue, lassitude. HEENT:   No-  headaches, difficulty swallowing, tooth/dental problems, sore throat,       No-  sneezing, itching, ear ache, nasal congestion, post nasal drip,  CV:  No-   chest pain, orthopnea, PND, swelling in lower extremities, anasarca, dizziness, palpitations Resp: +  shortness of breath with exertion or at rest.              No-   productive cough,  No- non-productive cough,  No- coughing up of blood.              No-   change in color of mucus.  + wheezing.   Skin: No-   rash or lesions. GI:  No-   heartburn, indigestion, abdominal pain, nausea, vomiting,  GU:  MS:  No-   joint pain or swelling.  Neuro-     nothing unusual Psych:  No- change in mood or affect. No depression or anxiety.  No memory loss.  OBJ- Physical Exam General- Alert, Oriented, Affect-appropriate/ calm , Distress- none acute Skin- rash-none, lesions- none, excoriation- none Lymphadenopathy- none Head- atraumatic            Eyes- Gross vision intact, PERRLA, conjunctivae and secretions clear            Ears- Hearing, canals-normal            Nose- Clear,  no-Septal dev, mucus, polyps, erosion, perforation             Throat- Mallampati III , mucosa clear , drainage- none, tonsils- atrophic Neck- flexible , trachea midline, no stridor , thyroid nl, carotid no bruit Chest - symmetrical excursion , unlabored           Heart/CV- RRR , no murmur , no gallop  , no rub, nl s1 s2                           - JVD- none , edema- none, stasis changes- none, varices- none           Lung- clear to P&A, wheezy cough+ , dullness-none, rub- none           Chest wall-  Abd-  Br/ Gen/ Rectal- Not done, not indicated Extrem-  cyanosis- none, clubbing, none, atrophy- none, strength- nl Neuro- grossly intact to observation

## 2014-05-06 IMAGING — CT CT PARANASAL SINUSES LIMITED
2 series · 15 of 19 positions shown, 18 images · non-contrast
Comparison: None.

CLINICAL DATA: Sinus pressure.  Facial swelling

CT PARANASAL SINUS LIMITED WITHOUT CONTRAST
TECHNIQUE: Multidetector CT images of the paranasal sinuses were
obtained in a single plane without contrast.

[Series 3: ax soft · axial · 0.31mm/px · z∈[+72,+112]mm · 5 of 7 slices shown]
[im 2/7  brain]
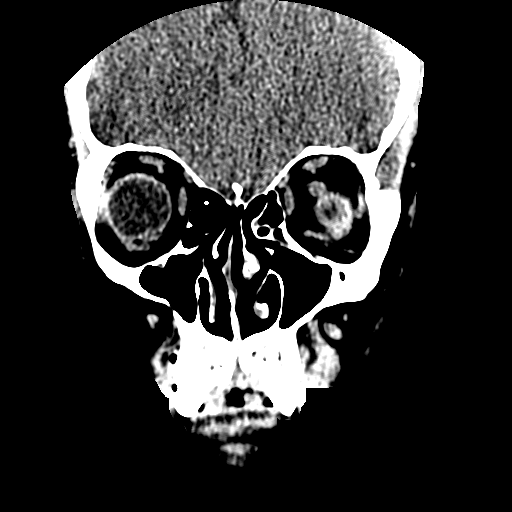
[im 3/7  brain]
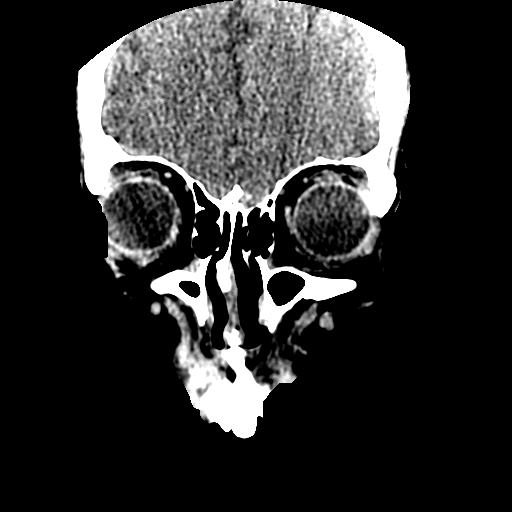
[im 4/7  brain]
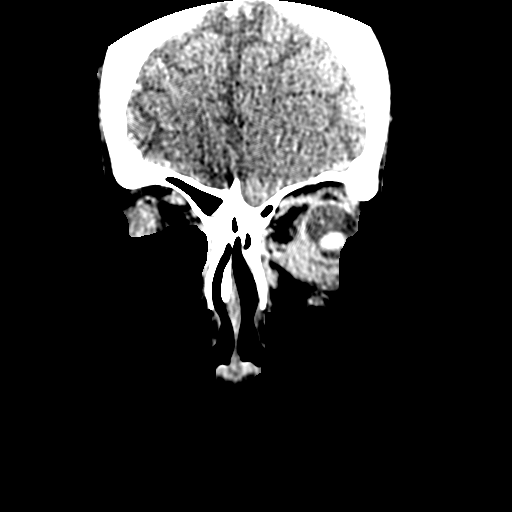
[im 5/7  brain]
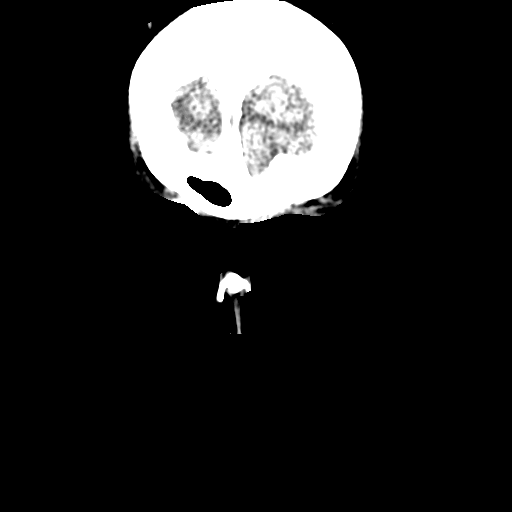
[im 6/7  brain]
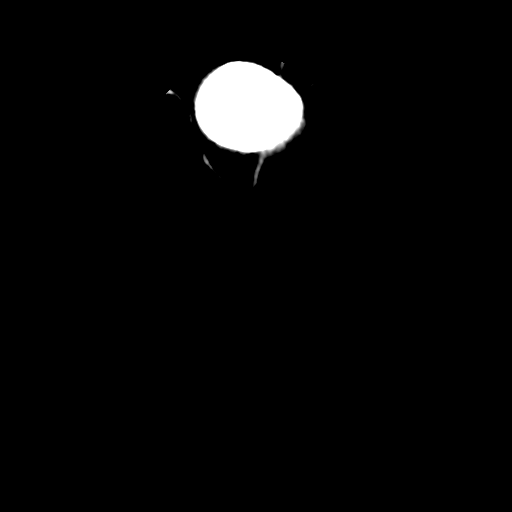

[Series 6: cor soft · axial · 0.31mm/px · z∈[+17,+107]mm · 10 of 12 slices shown, 13 images]
[im 2/12  brain]
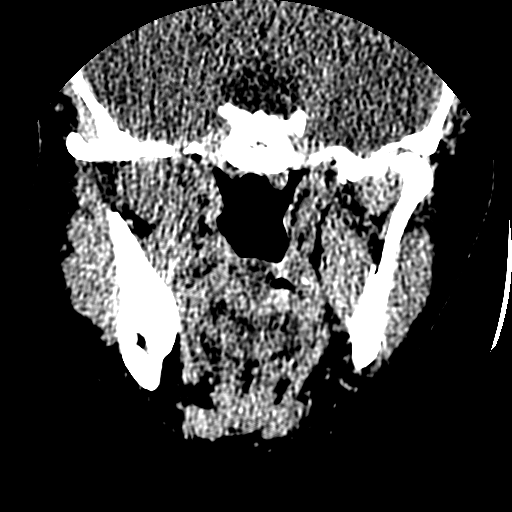
[im 2/12  bone]
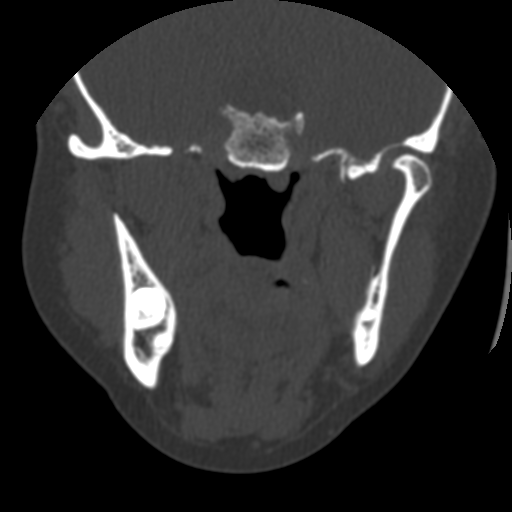
[im 3/12  bone]
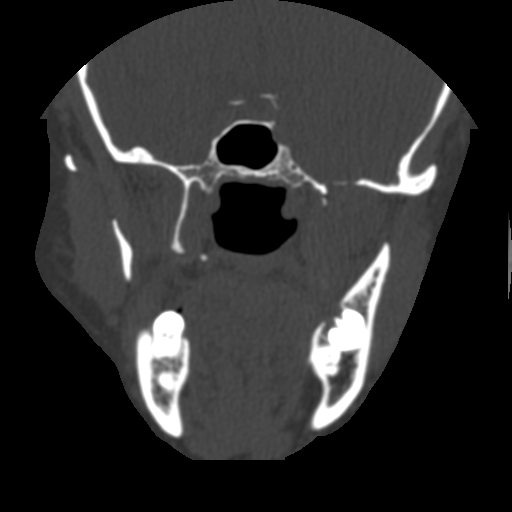
[im 4/12  bone]
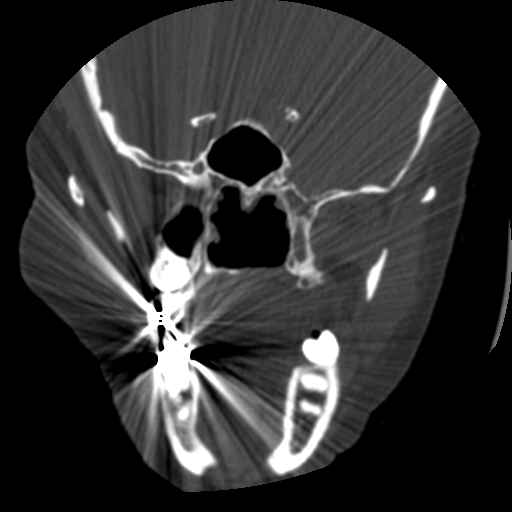
[im 5/12  bone]
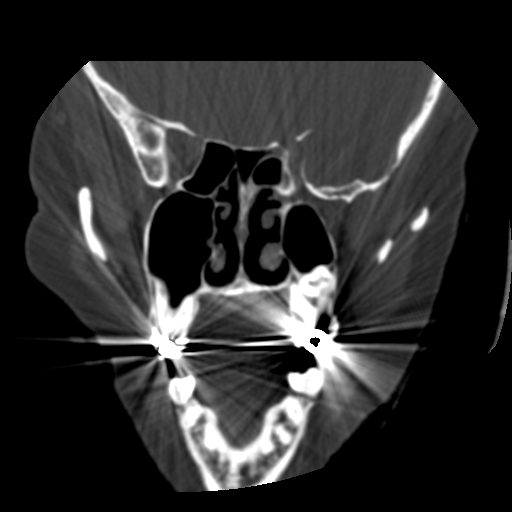
[im 6/12  brain]
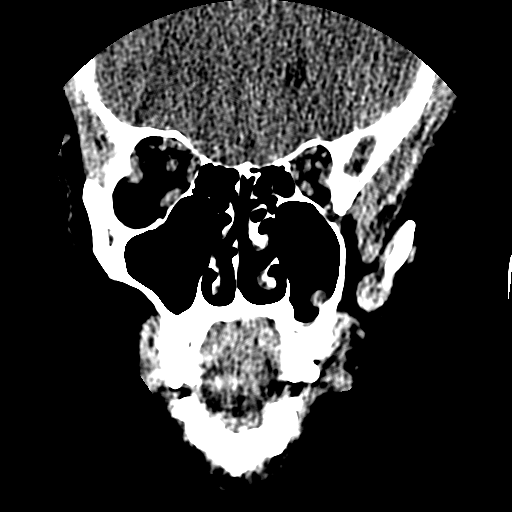
[im 6/12  bone]
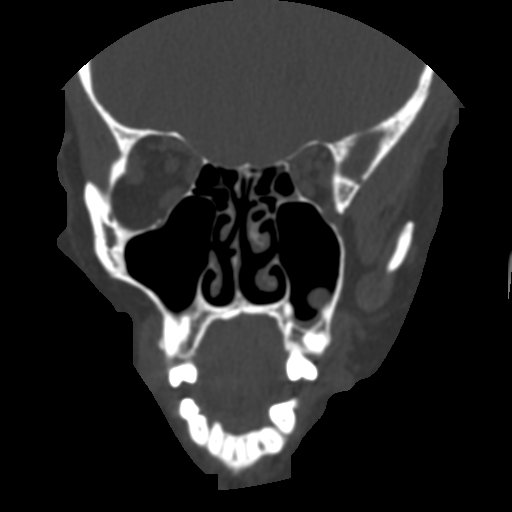
[im 7/12  bone]
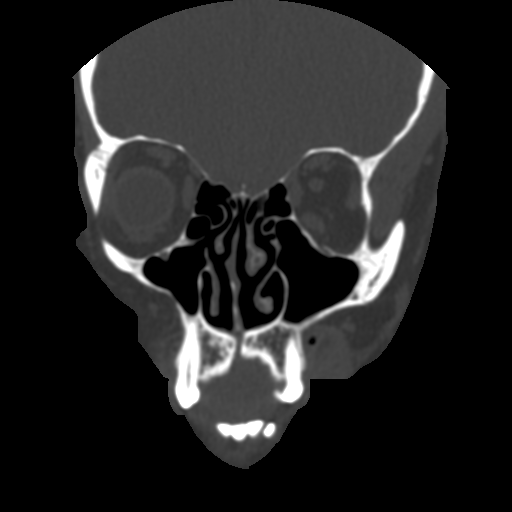
[im 8/12  bone]
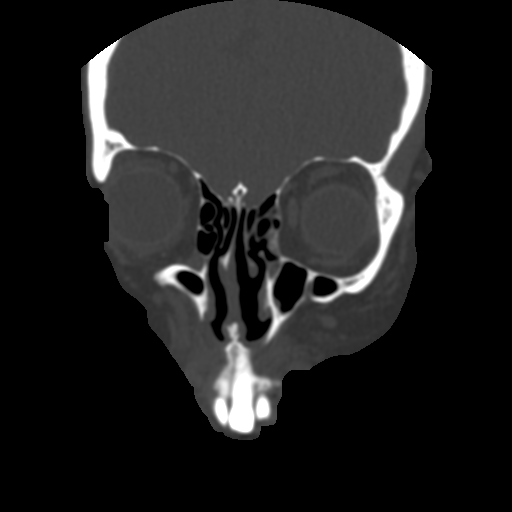
[im 9/12  bone]
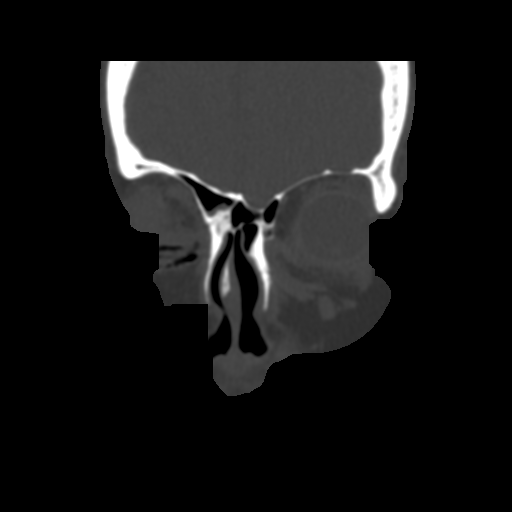
[im 10/12  brain]
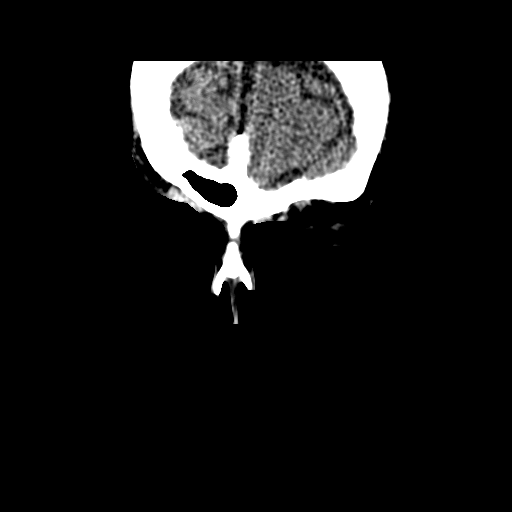
[im 10/12  bone]
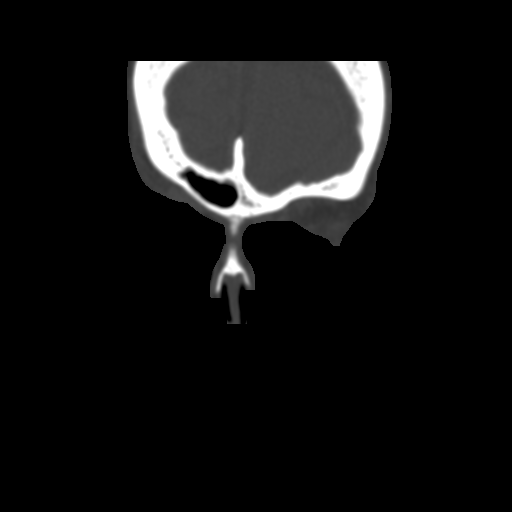
[im 11/12  bone]
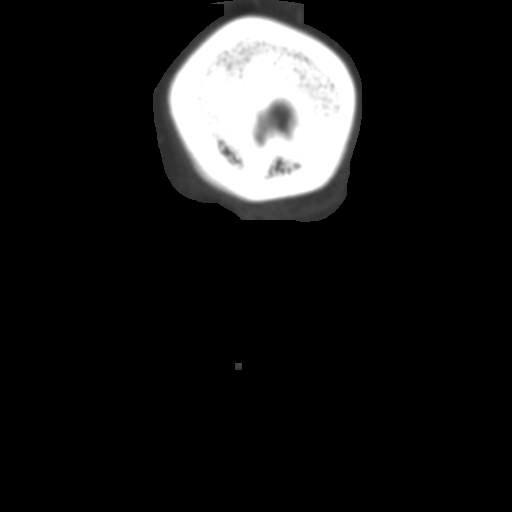

[15 of 19 positions shown; findings below may reference images not displayed]

FINDINGS: Mild mucosal edema in the frontal ethmoid and maxillary
sinuses bilaterally.  The sphenoid sinus is clear.  No air-fluid
level.

Nasal septum is mildly deviated to the left.  No fracture or acute
bony lesion.  No soft tissue mass.
IMPRESSION: Mild mucosal edema in the paranasal sinuses.  No air-fluid level.

## 2014-08-03 ENCOUNTER — Ambulatory Visit: Payer: BLUE CROSS/BLUE SHIELD | Admitting: Internal Medicine

## 2014-09-03 ENCOUNTER — Other Ambulatory Visit: Payer: Self-pay | Admitting: Internal Medicine

## 2015-03-21 ENCOUNTER — Telehealth: Payer: Self-pay | Admitting: Internal Medicine

## 2015-03-21 MED ORDER — AZELASTINE-FLUTICASONE 137-50 MCG/ACT NA SUSP: 2.0000 | Freq: Every day | NASAL | Status: DC

## 2015-03-21 MED ORDER — MOMETASONE FURO-FORMOTEROL FUM 100-5 MCG/ACT IN AERO: 2.0000 | INHALATION_SPRAY | Freq: Two times a day (BID) | RESPIRATORY_TRACT | Status: DC

## 2015-03-21 NOTE — Telephone Encounter (Signed)
Spoke with pt.  She would like dymista and dulera rx sent to [facility].   Last OV wth CY: April 2016.  No pending appt. Yearly OV scheduled for June 9. 2017 at 9:45 am. Rxs sent.   Pt aware to keep appt for refills. She verbalized understanding and voiced no further questions or concerns at this time.

## 2015-05-23 ENCOUNTER — Other Ambulatory Visit: Payer: Self-pay

## 2015-05-23 DIAGNOSIS — Z1231 Encounter for screening mammogram for malignant neoplasm of breast: Secondary | ICD-10-CM

## 2015-06-19 ENCOUNTER — Ambulatory Visit
Admission: RE | Admit: 2015-06-19 | Discharge: 2015-06-19 | Disposition: A | Discharge status: Home / Self Care | Payer: BLUE CROSS/BLUE SHIELD | Source: Ambulatory Visit

## 2015-06-19 DIAGNOSIS — Z1231 Encounter for screening mammogram for malignant neoplasm of breast: Secondary | ICD-10-CM

## 2015-06-20 ENCOUNTER — Encounter: Payer: Self-pay | Admitting: Women's Health

## 2015-06-22 ENCOUNTER — Encounter: Payer: Self-pay | Admitting: Internal Medicine

## 2015-06-22 ENCOUNTER — Ambulatory Visit (INDEPENDENT_AMBULATORY_CARE_PROVIDER_SITE_OTHER): Payer: BLUE CROSS/BLUE SHIELD | Admitting: Internal Medicine

## 2015-06-22 VITALS — BP 114/66 | HR 59 | Ht 60.0 in | Wt 139.6 lb

## 2015-06-22 DIAGNOSIS — J4520 Mild intermittent asthma, uncomplicated: Secondary | ICD-10-CM | POA: Diagnosis not present

## 2015-06-22 NOTE — Assessment & Plan Note (Signed)
Well-controlled, uncomplicated. Uses rescue inhaler prevent exercise-induced symptoms which she goes to the gym but is not needing it for breakthrough wheezing. We discussed use of maintenance inhaler for intervals when needed. She has done very well and is getting sufficient coverage between her Allergist at her primary physician they can refill her inhalers. We will be happy to see her again if needed.

## 2015-06-22 NOTE — Patient Instructions (Addendum)
You can get your meds refilled by your primary doctor or your allergists, so we will be happy to see you back here for pulmonary problems if you need [place].

## 2015-06-22 NOTE — Progress Notes (Signed)
02/06/12- 24 yoF former smoker -Self Referral-coughing spells; felt like she swallowed something and couldnt breathe-went to ER; also SOB happens about 20 minutes when working out. Reports breathing problems for 6 months especially during exertion such that she has to stop early. Cough and wheeze. Inhaler seems to help. Stress makes it worse. One week ago she had a new "esophageal" pain during stress, not while eating. She said was like something stuck. EKG was okay. Similar episode woke her from sleep and got more intense with shortness of breath over several minutes. Nebulizer at emergency room definitely helped. Some sense of food hang up. Now 3 episodes like this. TUMS antacid does not help. She is tapering prednisone. History of seasonal allergy treated with loratadine. No history of asthma. Pneumonia 6 months ago. She has been on lisinopril and we discussed possible effects of that on the upper airway  Reflux in the past. CXR 02/02/12- IMPRESSION:  Mild peribronchial thickening noted; lungs otherwise clear.  Original Report Authenticated By: Santa Lighter, M.D.   03/18/12- 65 yoF former smoker followed for dyspnea and cough FOLLOWS FOR: review PFT results with patient; has been using rescue inhaler only prior to working out.  Substernal pain resolved. Notes sinus pressure, post-nasal drip perennial. Was on allergy shots in the past. PFT 03/18/12- Mild obstructive airways disease, small airways, with response to bronchodilator. DLCO mildly reduced. FVC 2.03/ 73%, FEV1 1.50/ 72%, FEV1/FVC 0.74, TLC 90%, DLCO 72%.  09/24/12- 1 yoF former smoker followed for dyspnea and cough. Allergy vaccine at [facility] FOLLOWS FOR:  Having some SOB and wheezing x1 weeks. Denies f/c/s or tightness in chest Wheezing more in the last couple of weeks. Rescue inhaler helps at the gym.   05/03/14- 22 yoF former smoker followed for dyspnea and cough. Allergy vaccine at [facility] FOLLOWS FOR: follow up at MD request due to flare  ups of asthma Allergy Profile 02/06/12- Neg total IgE 41.3  CXR 02/02/12 IMPRESSION: Mild peribronchial thickening noted; lungs otherwise clear. Original Report Authenticated By: Santa Lighter, M.D.  06/22/2015-55 year old female former smoker followed for dyspnea, cough. Allergy Vaccine at [facility] FOLLOWS FOR: Pt states she is doing well overall. Denies any wheezing, cough, or SOB. Continues allergy shots from her Allergist Has felt stable with no major problems. Using [facility] for short stretches occasionally. Uses rescue inhaler once daily before going to the gym but has not needed it for breakthrough symptoms  ROS-see HPI Constitutional:   No-   weight loss, night sweats, fevers, chills, fatigue, lassitude. HEENT:   No-  headaches, difficulty swallowing, tooth/dental problems, sore throat,       No-  sneezing, itching, ear ache, nasal congestion, post nasal drip,  CV:  No-   chest pain, orthopnea, PND, swelling in lower extremities, anasarca, dizziness, palpitations Resp: +  shortness of breath with exertion or at rest.              No-   productive cough,  No- non-productive cough,  No- coughing up of blood.              No-   change in color of mucus.  + wheezing.   Skin: No-   rash or lesions. GI:  No-   heartburn, indigestion, abdominal pain, nausea, vomiting,  GU:  MS:  No-   joint pain or swelling.  Neuro-     nothing unusual Psych:  No- change in mood or affect. No depression or anxiety.  No memory loss.  OBJ- Physical Exam General-  Alert, Oriented, Affect-appropriate/ calm , Distress- none acute Skin- rash-none, lesions- none, excoriation- none Lymphadenopathy- none Head- atraumatic            Eyes- Gross vision intact, PERRLA, conjunctivae and secretions clear            Ears- Hearing, canals-normal            Nose- Clear, no-Septal dev, mucus, polyps, erosion, perforation             Throat- Mallampati III , mucosa clear , drainage- none, tonsils- atrophic Neck-  flexible , trachea midline, no stridor , thyroid nl, carotid no bruit Chest - symmetrical excursion , unlabored           Heart/CV- RRR , no murmur , no gallop  , no rub, nl s1 s2                           - JVD- none , edema- none, stasis changes- none, varices- none           Lung- clear to P&A, cough-none , dullness-none, rub- none           Chest wall-  Abd-  Br/ Gen/ Rectal- Not done, not indicated Extrem- cyanosis- none, clubbing, none, atrophy- none, strength- nl Neuro- grossly intact to observation

## 2015-10-30 ENCOUNTER — Other Ambulatory Visit: Payer: Self-pay | Admitting: *Deleted

## 2015-10-30 MED ORDER — ALBUTEROL SULFATE HFA 108 (90 BASE) MCG/ACT IN AERS: 1.0000 | INHALATION_SPRAY | Freq: Four times a day (QID) | RESPIRATORY_TRACT | 3 refills | Status: DC | PRN

## 2015-11-06 ENCOUNTER — Other Ambulatory Visit: Payer: Self-pay | Admitting: Internal Medicine

## 2015-11-12 ENCOUNTER — Other Ambulatory Visit: Payer: Self-pay | Admitting: Internal Medicine

## 2016-01-23 ENCOUNTER — Other Ambulatory Visit: Payer: Self-pay | Admitting: Internal Medicine

## 2016-03-11 ENCOUNTER — Telehealth: Payer: Self-pay

## 2016-03-11 NOTE — Telephone Encounter (Signed)
I am no longer following her for routine care and refills because she doesn't need me. Per last ov, she needs to get this addressed by either her allergist at [facility], or by her PCP.

## 2016-03-11 NOTE — Telephone Encounter (Signed)
Received a PA request from [facility] on [address] for Dulera 100. Called to initiate PA at [phone], was told that before a PA could be initiated pt must have either tried and failed advair, breo, and symbicort (all 3) OR have a contraindication to changing inhalers.    CY please advise on which inhaler you'd like to switch to, or if pt does in fact have a contraindication with switching inhalers.  Thanks!

## 2016-03-11 NOTE — Telephone Encounter (Signed)
Pt aware that she needs to contact her Allergist or PCP to have them complete the PA for her [facility]. Pt aware that she must try/fail 3 mediations or explain medical necessity of staying on Dulera. Pt expressed understanding. Nothing further needed.

## 2016-05-28 ENCOUNTER — Encounter: Payer: Self-pay | Admitting: Gynecology

## 2016-09-11 ENCOUNTER — Other Ambulatory Visit: Payer: Self-pay | Admitting: Internal Medicine

## 2017-04-16 DIAGNOSIS — J3081 Allergic rhinitis due to animal (cat) (dog) hair and dander: Secondary | ICD-10-CM | POA: Diagnosis not present

## 2017-04-16 DIAGNOSIS — J301 Allergic rhinitis due to pollen: Secondary | ICD-10-CM | POA: Diagnosis not present

## 2017-04-17 DIAGNOSIS — J3089 Other allergic rhinitis: Secondary | ICD-10-CM | POA: Diagnosis not present

## 2017-06-24 DIAGNOSIS — J301 Allergic rhinitis due to pollen: Secondary | ICD-10-CM | POA: Diagnosis not present

## 2017-06-24 DIAGNOSIS — J4540 Moderate persistent asthma, uncomplicated: Secondary | ICD-10-CM | POA: Diagnosis not present

## 2017-06-24 DIAGNOSIS — J019 Acute sinusitis, unspecified: Secondary | ICD-10-CM | POA: Diagnosis not present

## 2017-06-24 DIAGNOSIS — J3089 Other allergic rhinitis: Secondary | ICD-10-CM | POA: Diagnosis not present

## 2017-09-25 ENCOUNTER — Ambulatory Visit
Admission: RE | Admit: 2017-09-25 | Discharge: 2017-09-25 | Disposition: A | Discharge status: Home / Self Care | Payer: BLUE CROSS/BLUE SHIELD | Source: Ambulatory Visit | Attending: Family Medicine | Admitting: Family Medicine

## 2017-09-25 ENCOUNTER — Other Ambulatory Visit: Payer: Self-pay | Admitting: Family Medicine

## 2017-09-25 DIAGNOSIS — S99911A Unspecified injury of right ankle, initial encounter: Secondary | ICD-10-CM | POA: Diagnosis not present

## 2017-09-25 DIAGNOSIS — S93401S Sprain of unspecified ligament of right ankle, sequela: Secondary | ICD-10-CM

## 2017-10-22 DIAGNOSIS — Z23 Encounter for immunization: Secondary | ICD-10-CM | POA: Diagnosis not present

## 2017-10-22 DIAGNOSIS — L570 Actinic keratosis: Secondary | ICD-10-CM | POA: Diagnosis not present

## 2018-01-13 DIAGNOSIS — K37 Unspecified appendicitis: Secondary | ICD-10-CM

## 2018-01-13 HISTORY — DX: Unspecified appendicitis: K37

## 2018-02-04 DIAGNOSIS — J019 Acute sinusitis, unspecified: Secondary | ICD-10-CM | POA: Diagnosis not present

## 2018-02-04 DIAGNOSIS — J4540 Moderate persistent asthma, uncomplicated: Secondary | ICD-10-CM | POA: Diagnosis not present

## 2018-02-04 DIAGNOSIS — J301 Allergic rhinitis due to pollen: Secondary | ICD-10-CM | POA: Diagnosis not present

## 2018-02-04 DIAGNOSIS — J3089 Other allergic rhinitis: Secondary | ICD-10-CM | POA: Diagnosis not present

## 2018-02-11 DIAGNOSIS — J3081 Allergic rhinitis due to animal (cat) (dog) hair and dander: Secondary | ICD-10-CM | POA: Diagnosis not present

## 2018-02-11 DIAGNOSIS — J301 Allergic rhinitis due to pollen: Secondary | ICD-10-CM | POA: Diagnosis not present

## 2018-02-12 DIAGNOSIS — J3089 Other allergic rhinitis: Secondary | ICD-10-CM | POA: Diagnosis not present

## 2018-03-02 ENCOUNTER — Other Ambulatory Visit: Payer: Self-pay | Admitting: Women's Health

## 2018-03-02 DIAGNOSIS — Z1231 Encounter for screening mammogram for malignant neoplasm of breast: Secondary | ICD-10-CM

## 2018-04-01 ENCOUNTER — Inpatient Hospital Stay: Admission: RE | Admit: 2018-04-01 | Payer: BLUE CROSS/BLUE SHIELD | Source: Ambulatory Visit

## 2018-04-16 DIAGNOSIS — J3089 Other allergic rhinitis: Secondary | ICD-10-CM | POA: Diagnosis not present

## 2018-04-16 DIAGNOSIS — J301 Allergic rhinitis due to pollen: Secondary | ICD-10-CM | POA: Diagnosis not present

## 2018-04-16 DIAGNOSIS — J3081 Allergic rhinitis due to animal (cat) (dog) hair and dander: Secondary | ICD-10-CM | POA: Diagnosis not present

## 2018-07-02 ENCOUNTER — Other Ambulatory Visit: Payer: Self-pay

## 2018-07-02 ENCOUNTER — Ambulatory Visit
Admission: RE | Admit: 2018-07-02 | Discharge: 2018-07-02 | Disposition: A | Discharge status: Home / Self Care | Payer: BC Managed Care – PPO | Source: Ambulatory Visit | Attending: Women's Health | Admitting: Women's Health

## 2018-07-02 DIAGNOSIS — Z1231 Encounter for screening mammogram for malignant neoplasm of breast: Secondary | ICD-10-CM

## 2018-10-11 ENCOUNTER — Other Ambulatory Visit: Payer: Self-pay

## 2018-10-11 ENCOUNTER — Encounter (HOSPITAL_COMMUNITY): Payer: Self-pay | Admitting: Emergency Medicine

## 2018-10-11 DIAGNOSIS — R112 Nausea with vomiting, unspecified: Secondary | ICD-10-CM | POA: Insufficient documentation

## 2018-10-11 DIAGNOSIS — K4020 Bilateral inguinal hernia, without obstruction or gangrene, not specified as recurrent: Secondary | ICD-10-CM | POA: Diagnosis not present

## 2018-10-11 DIAGNOSIS — E785 Hyperlipidemia, unspecified: Secondary | ICD-10-CM | POA: Diagnosis not present

## 2018-10-11 DIAGNOSIS — K7689 Other specified diseases of liver: Secondary | ICD-10-CM | POA: Diagnosis not present

## 2018-10-11 DIAGNOSIS — R197 Diarrhea, unspecified: Secondary | ICD-10-CM | POA: Diagnosis not present

## 2018-10-11 DIAGNOSIS — R1031 Right lower quadrant pain: Secondary | ICD-10-CM | POA: Diagnosis not present

## 2018-10-11 DIAGNOSIS — I10 Essential (primary) hypertension: Secondary | ICD-10-CM | POA: Diagnosis not present

## 2018-10-11 DIAGNOSIS — Z9049 Acquired absence of other specified parts of digestive tract: Secondary | ICD-10-CM | POA: Diagnosis not present

## 2018-10-11 DIAGNOSIS — Z87891 Personal history of nicotine dependence: Secondary | ICD-10-CM | POA: Diagnosis not present

## 2018-10-11 DIAGNOSIS — R1084 Generalized abdominal pain: Secondary | ICD-10-CM | POA: Diagnosis not present

## 2018-10-11 DIAGNOSIS — R1033 Periumbilical pain: Secondary | ICD-10-CM | POA: Diagnosis not present

## 2018-10-11 DIAGNOSIS — Z5321 Procedure and treatment not carried out due to patient leaving prior to being seen by health care provider: Secondary | ICD-10-CM | POA: Insufficient documentation

## 2018-10-11 DIAGNOSIS — F419 Anxiety disorder, unspecified: Secondary | ICD-10-CM | POA: Diagnosis not present

## 2018-10-11 DIAGNOSIS — K3580 Unspecified acute appendicitis: Secondary | ICD-10-CM | POA: Diagnosis not present

## 2018-10-11 DIAGNOSIS — K449 Diaphragmatic hernia without obstruction or gangrene: Secondary | ICD-10-CM | POA: Diagnosis not present

## 2018-10-11 MED ORDER — SODIUM CHLORIDE 0.9% FLUSH: 3.0000 mL | Freq: Once | INTRAVENOUS | Status: DC

## 2018-10-11 NOTE — ED Triage Notes (Signed)
Patient here from home with complaints of abd pain increased above belly button. Nausea, vomiting that started at 3pm today.

## 2018-10-12 ENCOUNTER — Emergency Department (HOSPITAL_COMMUNITY)
Admission: EM | Admit: 2018-10-12 | Discharge: 2018-10-12 | Discharge status: Against Medical Advice | Payer: BC Managed Care – PPO | Attending: Emergency Medicine | Admitting: Emergency Medicine

## 2018-10-12 DIAGNOSIS — R1084 Generalized abdominal pain: Secondary | ICD-10-CM | POA: Diagnosis not present

## 2018-10-12 DIAGNOSIS — K4020 Bilateral inguinal hernia, without obstruction or gangrene, not specified as recurrent: Secondary | ICD-10-CM | POA: Diagnosis not present

## 2018-10-12 DIAGNOSIS — K7689 Other specified diseases of liver: Secondary | ICD-10-CM | POA: Diagnosis not present

## 2018-10-12 DIAGNOSIS — K37 Unspecified appendicitis: Secondary | ICD-10-CM | POA: Diagnosis not present

## 2018-10-12 DIAGNOSIS — K449 Diaphragmatic hernia without obstruction or gangrene: Secondary | ICD-10-CM | POA: Diagnosis not present

## 2018-10-12 DIAGNOSIS — R197 Diarrhea, unspecified: Secondary | ICD-10-CM | POA: Diagnosis not present

## 2018-10-12 DIAGNOSIS — K3580 Unspecified acute appendicitis: Secondary | ICD-10-CM | POA: Diagnosis not present

## 2018-10-13 ENCOUNTER — Ambulatory Visit: Payer: Self-pay | Admitting: Women's Health

## 2019-01-05 ENCOUNTER — Other Ambulatory Visit: Payer: Self-pay

## 2019-01-10 ENCOUNTER — Ambulatory Visit: Payer: BC Managed Care – PPO | Admitting: Women's Health

## 2019-01-10 ENCOUNTER — Other Ambulatory Visit: Payer: Self-pay

## 2019-01-10 ENCOUNTER — Encounter: Payer: Self-pay | Admitting: Women's Health

## 2019-01-10 VITALS — BP 118/80 | Ht <= 58 in | Wt 154.0 lb

## 2019-01-10 DIAGNOSIS — Z01419 Encounter for gynecological examination (general) (routine) without abnormal findings: Secondary | ICD-10-CM | POA: Diagnosis not present

## 2019-01-10 DIAGNOSIS — Z13820 Encounter for screening for osteoporosis: Secondary | ICD-10-CM

## 2019-01-10 MED ORDER — ESTRADIOL 10 MCG VA TABS: ORAL_TABLET | VAGINAL | 4 refills | Status: DC

## 2019-01-10 NOTE — Addendum Note (Signed)
Addended by: Lorine Bears on: 01/10/2019 02:33 PM   Modules accepted: Orders

## 2019-01-10 NOTE — Patient Instructions (Signed)
Good to see you today Vit d 2000 iu daily  Health Maintenance for Postmenopausal Women Menopause is a normal process in which your ability to get pregnant comes to an end. This process happens slowly over many months or years, usually between the ages of 78 and 64. Menopause is complete when you have missed your menstrual periods for 12 months. It is important to talk with your health care provider about some of the most common conditions that affect women after menopause (postmenopausal women). These include heart disease, cancer, and bone loss (osteoporosis). Adopting a healthy lifestyle and getting preventive care can help to promote your health and wellness. The actions you take can also lower your chances of developing some of these common conditions. What should I know about menopause? During menopause, you may get a number of symptoms, such as:  Hot flashes. These can be moderate or severe.  Night sweats.  Decrease in sex drive.  Mood swings.  Headaches.  Tiredness.  Irritability.  Memory problems.  Insomnia. Choosing to treat or not to treat these symptoms is a decision that you make with your health care provider. Do I need hormone replacement therapy?  Hormone replacement therapy is effective in treating symptoms that are caused by menopause, such as hot flashes and night sweats.  Hormone replacement carries certain risks, especially as you become older. If you are thinking about using estrogen or estrogen with progestin, discuss the benefits and risks with your health care provider. What is my risk for heart disease and stroke? The risk of heart disease, heart attack, and stroke increases as you age. One of the causes may be a change in the body's hormones during menopause. This can affect how your body uses dietary fats, triglycerides, and cholesterol. Heart attack and stroke are medical emergencies. There are many things that you can do to help prevent heart disease and  stroke. Watch your blood pressure  High blood pressure causes heart disease and increases the risk of stroke. This is more likely to develop in people who have high blood pressure readings, are of African descent, or are overweight.  Have your blood pressure checked: ? Every 3-5 years if you are 90-76 years of age. ? Every year if you are 104 years old or older. Eat a healthy diet   Eat a diet that includes plenty of vegetables, fruits, low-fat dairy products, and lean protein.  Do not eat a lot of foods that are high in solid fats, added sugars, or sodium. Get regular exercise Get regular exercise. This is one of the most important things you can do for your health. Most adults should:  Try to exercise for at least 150 minutes each week. The exercise should increase your heart rate and make you sweat (moderate-intensity exercise).  Try to do strengthening exercises at least twice each week. Do these in addition to the moderate-intensity exercise.  Spend less time sitting. Even light physical activity can be beneficial. Other tips  Work with your health care provider to achieve or maintain a healthy weight.  Do not use any products that contain nicotine or tobacco, such as cigarettes, e-cigarettes, and chewing tobacco. If you need help quitting, ask your health care provider.  Know your numbers. Ask your health care provider to check your cholesterol and your blood sugar (glucose). Continue to have your blood tested as directed by your health care provider. Do I need screening for cancer? Depending on your health history and family history, you may need  to have cancer screening at different stages of your life. This may include screening for:  Breast cancer.  Cervical cancer.  Lung cancer.  Colorectal cancer. What is my risk for osteoporosis? After menopause, you may be at increased risk for osteoporosis. Osteoporosis is a condition in which bone destruction happens more  quickly than new bone creation. To help prevent osteoporosis or the bone fractures that can happen because of osteoporosis, you may take the following actions:  If you are 58-33 years old, get at least 1,000 mg of calcium and at least 600 mg of vitamin D per day.  If you are older than age 54 but younger than age 23, get at least 1,200 mg of calcium and at least 600 mg of vitamin D per day.  If you are older than age 31, get at least 1,200 mg of calcium and at least 800 mg of vitamin D per day. Smoking and drinking excessive alcohol increase the risk of osteoporosis. Eat foods that are rich in calcium and vitamin D, and do weight-bearing exercises several times each week as directed by your health care provider. How does menopause affect my mental health? Depression may occur at any age, but it is more common as you become older. Common symptoms of depression include:  Low or sad mood.  Changes in sleep patterns.  Changes in appetite or eating patterns.  Feeling an overall lack of motivation or enjoyment of activities that you previously enjoyed.  Frequent crying spells. Talk with your health care provider if you think that you are experiencing depression. General instructions See your health care provider for regular wellness exams and vaccines. This may include:  Scheduling regular health, dental, and eye exams.  Getting and maintaining your vaccines. These include: ? Influenza vaccine. Get this vaccine each year before the flu season begins. ? Pneumonia vaccine. ? Shingles vaccine. ? Tetanus, diphtheria, and pertussis (Tdap) booster vaccine. Your health care provider may also recommend other immunizations. Tell your health care provider if you have ever been abused or do not feel safe at home. Summary  Menopause is a normal process in which your ability to get pregnant comes to an end.  This condition causes hot flashes, night sweats, decreased interest in sex, mood swings,  headaches, or lack of sleep.  Treatment for this condition may include hormone replacement therapy.  Take actions to keep yourself healthy, including exercising regularly, eating a healthy diet, watching your weight, and checking your blood pressure and blood sugar levels.  Get screened for cancer and depression. Make sure that you are up to date with all your vaccines. This information is not intended to replace advice given to you by your health care provider. Make sure you discuss any questions you have with your health care provider. Document Released: 02/21/2005 Document Revised: 12/23/2017 Document Reviewed: 12/23/2017 Elsevier Patient Education  2020 [employer].

## 2019-01-10 NOTE — Addendum Note (Signed)
Addended by: Huel Cote on: 01/10/2019 01:17 PM   Modules accepted: Orders

## 2019-01-10 NOTE — Progress Notes (Addendum)
Carla Cline 06-05-1960 [id number]    History:    Presents for annual exam.  Postmenopausal no HRT with no bleeding.  Extreme vaginal dryness unable to have intercourse.  Normal Pap and mammogram history.  Primary care manages hypertension, hypercholesteremia and asthma.  2011 benign colon polyp 10-year follow-up.  09/2018 appendectomy.  Past medical history, past surgical history, family history and social history were all reviewed and documented in the EPIC chart.  Works at [employer].  Daughter 33, 28-year-old granddaughter and adopted son 37 both doing well.  Mother hypertension and breast cancer in her 47s, lives independently.  Father deceased age 71 from heart disease.  ROS:  A ROS was performed and pertinent positives and negatives are included.  Exam:  Vitals:   01/10/19 1210  BP: 118/80  Weight: 154 lb (69.9 kg)  Height: 4\' 10"  (1.473 m)   Body mass index is 32.19 kg/m.   General appearance:  Normal Thyroid:  Symmetrical, normal in size, without palpable masses or nodularity. Respiratory  Auscultation:  Clear without wheezing or rhonchi Cardiovascular  Auscultation:  Regular rate, without rubs, murmurs or gallops  Edema/varicosities:  Not grossly evident Abdominal  Soft,nontender, without masses, guarding or rebound.  Liver/spleen:  No organomegaly noted  Hernia:  None appreciated  Skin  Inspection:  Grossly normal   Breasts: Examined lying and sitting.     Right: Without masses, retractions, discharge or axillary adenopathy.     Left: Without masses, retractions, discharge or axillary adenopathy. Gentitourinary   Inguinal/mons:  Normal without inguinal adenopathy  External genitalia:  Normal  BUS/Urethra/Skene's glands:  Normal  Vagina: Atrophic   Cervix:  Normal  Uterus:   normal in size, shape and contour.  Midline and mobile  Adnexa/parametria:     Rt: Without masses or tenderness.   Lt: Without masses or tenderness.  Anus and perineum: Normal  Digital  rectal exam: Normal sphincter tone without palpated masses or tenderness  Assessment/Plan:  58 y.o. MWF G1, P1 +1 adopted for annual exam with complaint of painful intercourse.  Postmenopausal no HRT/no bleeding/vaginal atrophy with dyspareunia Hypertension, hypercholesteremia, asthma, anxiety/depression-primary care managing labs and meds  Plan: SBEs, continue annual screening mammogram, calcium rich foods, vitamin D 2000 daily encouraged.  Treatment for vaginal atrophy reviewed we will try Vagifem 1 applicator per vagina for 2 weeks and then twice weekly thereafter.  Continue over-the-counter vaginal lubricants and call if no relief.  Calcium rich foods, low calorie/carb diet encouraged.  DEXA instructed to schedule here.  Weightbearing and balance type exercise encouraged, home safety and fall prevention discussed..  Pap with HR HPV typing.  New screening guidelines reviewed.    [place], 1:04 PM 01/10/2019

## 2019-01-11 LAB — PAP, TP IMAGING W/ HPV RNA, RFLX HPV TYPE 16,18/45: HPV DNA High Risk: NOT DETECTED

## 2019-02-02 DIAGNOSIS — Z03818 Encounter for observation for suspected exposure to other biological agents ruled out: Secondary | ICD-10-CM | POA: Diagnosis not present

## 2019-06-10 DIAGNOSIS — J301 Allergic rhinitis due to pollen: Secondary | ICD-10-CM | POA: Diagnosis not present

## 2019-06-10 DIAGNOSIS — H1045 Other chronic allergic conjunctivitis: Secondary | ICD-10-CM | POA: Diagnosis not present

## 2019-06-10 DIAGNOSIS — J3081 Allergic rhinitis due to animal (cat) (dog) hair and dander: Secondary | ICD-10-CM | POA: Diagnosis not present

## 2019-06-10 DIAGNOSIS — J3089 Other allergic rhinitis: Secondary | ICD-10-CM | POA: Diagnosis not present

## 2019-06-20 DIAGNOSIS — J301 Allergic rhinitis due to pollen: Secondary | ICD-10-CM | POA: Diagnosis not present

## 2019-06-20 DIAGNOSIS — J3081 Allergic rhinitis due to animal (cat) (dog) hair and dander: Secondary | ICD-10-CM | POA: Diagnosis not present

## 2019-06-21 DIAGNOSIS — J3089 Other allergic rhinitis: Secondary | ICD-10-CM | POA: Diagnosis not present

## 2019-07-14 DIAGNOSIS — J301 Allergic rhinitis due to pollen: Secondary | ICD-10-CM | POA: Diagnosis not present

## 2019-07-14 DIAGNOSIS — J3089 Other allergic rhinitis: Secondary | ICD-10-CM | POA: Diagnosis not present

## 2019-07-14 DIAGNOSIS — J3081 Allergic rhinitis due to animal (cat) (dog) hair and dander: Secondary | ICD-10-CM | POA: Diagnosis not present

## 2019-07-20 DIAGNOSIS — J3089 Other allergic rhinitis: Secondary | ICD-10-CM | POA: Diagnosis not present

## 2019-07-20 DIAGNOSIS — J301 Allergic rhinitis due to pollen: Secondary | ICD-10-CM | POA: Diagnosis not present

## 2019-07-20 DIAGNOSIS — J3081 Allergic rhinitis due to animal (cat) (dog) hair and dander: Secondary | ICD-10-CM | POA: Diagnosis not present

## 2019-07-22 DIAGNOSIS — J3081 Allergic rhinitis due to animal (cat) (dog) hair and dander: Secondary | ICD-10-CM | POA: Diagnosis not present

## 2019-07-22 DIAGNOSIS — J3089 Other allergic rhinitis: Secondary | ICD-10-CM | POA: Diagnosis not present

## 2019-07-22 DIAGNOSIS — J301 Allergic rhinitis due to pollen: Secondary | ICD-10-CM | POA: Diagnosis not present

## 2019-07-25 DIAGNOSIS — J301 Allergic rhinitis due to pollen: Secondary | ICD-10-CM | POA: Diagnosis not present

## 2019-07-25 DIAGNOSIS — J3081 Allergic rhinitis due to animal (cat) (dog) hair and dander: Secondary | ICD-10-CM | POA: Diagnosis not present

## 2019-07-25 DIAGNOSIS — J3089 Other allergic rhinitis: Secondary | ICD-10-CM | POA: Diagnosis not present

## 2019-07-27 DIAGNOSIS — J301 Allergic rhinitis due to pollen: Secondary | ICD-10-CM | POA: Diagnosis not present

## 2019-07-27 DIAGNOSIS — J3089 Other allergic rhinitis: Secondary | ICD-10-CM | POA: Diagnosis not present

## 2019-07-27 DIAGNOSIS — J3081 Allergic rhinitis due to animal (cat) (dog) hair and dander: Secondary | ICD-10-CM | POA: Diagnosis not present

## 2019-07-29 DIAGNOSIS — J301 Allergic rhinitis due to pollen: Secondary | ICD-10-CM | POA: Diagnosis not present

## 2019-07-29 DIAGNOSIS — J3089 Other allergic rhinitis: Secondary | ICD-10-CM | POA: Diagnosis not present

## 2019-07-29 DIAGNOSIS — J3081 Allergic rhinitis due to animal (cat) (dog) hair and dander: Secondary | ICD-10-CM | POA: Diagnosis not present

## 2019-08-01 DIAGNOSIS — J301 Allergic rhinitis due to pollen: Secondary | ICD-10-CM | POA: Diagnosis not present

## 2019-08-01 DIAGNOSIS — J3089 Other allergic rhinitis: Secondary | ICD-10-CM | POA: Diagnosis not present

## 2019-08-01 DIAGNOSIS — J3081 Allergic rhinitis due to animal (cat) (dog) hair and dander: Secondary | ICD-10-CM | POA: Diagnosis not present

## 2019-08-03 DIAGNOSIS — J3081 Allergic rhinitis due to animal (cat) (dog) hair and dander: Secondary | ICD-10-CM | POA: Diagnosis not present

## 2019-08-03 DIAGNOSIS — J3089 Other allergic rhinitis: Secondary | ICD-10-CM | POA: Diagnosis not present

## 2019-08-03 DIAGNOSIS — J301 Allergic rhinitis due to pollen: Secondary | ICD-10-CM | POA: Diagnosis not present

## 2019-08-05 DIAGNOSIS — J3081 Allergic rhinitis due to animal (cat) (dog) hair and dander: Secondary | ICD-10-CM | POA: Diagnosis not present

## 2019-08-05 DIAGNOSIS — J301 Allergic rhinitis due to pollen: Secondary | ICD-10-CM | POA: Diagnosis not present

## 2019-08-05 DIAGNOSIS — J3089 Other allergic rhinitis: Secondary | ICD-10-CM | POA: Diagnosis not present

## 2019-08-10 DIAGNOSIS — J301 Allergic rhinitis due to pollen: Secondary | ICD-10-CM | POA: Diagnosis not present

## 2019-08-10 DIAGNOSIS — J3089 Other allergic rhinitis: Secondary | ICD-10-CM | POA: Diagnosis not present

## 2019-08-30 DIAGNOSIS — J301 Allergic rhinitis due to pollen: Secondary | ICD-10-CM | POA: Diagnosis not present

## 2019-08-30 DIAGNOSIS — J3081 Allergic rhinitis due to animal (cat) (dog) hair and dander: Secondary | ICD-10-CM | POA: Diagnosis not present

## 2019-08-30 DIAGNOSIS — J3089 Other allergic rhinitis: Secondary | ICD-10-CM | POA: Diagnosis not present

## 2019-09-02 DIAGNOSIS — J3081 Allergic rhinitis due to animal (cat) (dog) hair and dander: Secondary | ICD-10-CM | POA: Diagnosis not present

## 2019-09-02 DIAGNOSIS — J3089 Other allergic rhinitis: Secondary | ICD-10-CM | POA: Diagnosis not present

## 2019-09-02 DIAGNOSIS — J301 Allergic rhinitis due to pollen: Secondary | ICD-10-CM | POA: Diagnosis not present

## 2019-09-06 DIAGNOSIS — J3081 Allergic rhinitis due to animal (cat) (dog) hair and dander: Secondary | ICD-10-CM | POA: Diagnosis not present

## 2019-09-06 DIAGNOSIS — J3089 Other allergic rhinitis: Secondary | ICD-10-CM | POA: Diagnosis not present

## 2019-09-06 DIAGNOSIS — J301 Allergic rhinitis due to pollen: Secondary | ICD-10-CM | POA: Diagnosis not present

## 2020-01-18 ENCOUNTER — Encounter: Payer: BC Managed Care – PPO | Admitting: Nurse Practitioner

## 2020-05-15 LAB — HM PAP SMEAR: HM Pap smear: NEGATIVE

## 2021-01-18 ENCOUNTER — Encounter: Payer: Self-pay | Admitting: Internal Medicine

## 2021-01-18 ENCOUNTER — Other Ambulatory Visit: Payer: Self-pay

## 2021-01-18 ENCOUNTER — Other Ambulatory Visit: Payer: Self-pay | Admitting: Family Medicine

## 2021-01-18 ENCOUNTER — Ambulatory Visit (HOSPITAL_COMMUNITY)
Admission: RE | Admit: 2021-01-18 | Discharge: 2021-01-18 | Disposition: A | Discharge status: Home / Self Care | Payer: 59 | Source: Ambulatory Visit | Attending: Family Medicine | Admitting: Family Medicine

## 2021-01-18 DIAGNOSIS — M7989 Other specified soft tissue disorders: Secondary | ICD-10-CM | POA: Diagnosis present

## 2021-02-01 ENCOUNTER — Other Ambulatory Visit: Payer: Self-pay

## 2021-02-01 ENCOUNTER — Ambulatory Visit (AMBULATORY_SURGERY_CENTER): Payer: 59 | Admitting: *Deleted

## 2021-02-01 VITALS — Ht 60.0 in | Wt 153.0 lb

## 2021-02-01 DIAGNOSIS — Z1211 Encounter for screening for malignant neoplasm of colon: Secondary | ICD-10-CM

## 2021-02-01 MED ORDER — NA SULFATE-K SULFATE-MG SULF 17.5-3.13-1.6 GM/177ML PO SOLN: 2.0000 | Freq: Once | ORAL | 0 refills | Status: AC

## 2021-02-01 NOTE — Progress Notes (Signed)

## 2021-02-14 ENCOUNTER — Encounter: Payer: Self-pay | Admitting: Internal Medicine

## 2021-02-20 ENCOUNTER — Encounter: Payer: Self-pay | Admitting: Internal Medicine

## 2021-02-20 ENCOUNTER — Other Ambulatory Visit: Payer: Self-pay

## 2021-02-20 ENCOUNTER — Ambulatory Visit (AMBULATORY_SURGERY_CENTER): Payer: 59 | Admitting: Gastroenterology

## 2021-02-20 VITALS — BP 127/77 | HR 79 | Temp 98.4°F | Resp 20 | Ht <= 58 in | Wt 153.0 lb

## 2021-02-20 DIAGNOSIS — Z1211 Encounter for screening for malignant neoplasm of colon: Secondary | ICD-10-CM | POA: Diagnosis not present

## 2021-02-20 DIAGNOSIS — D127 Benign neoplasm of rectosigmoid junction: Secondary | ICD-10-CM

## 2021-02-20 DIAGNOSIS — Z800 Family history of malignant neoplasm of digestive organs: Secondary | ICD-10-CM

## 2021-02-20 DIAGNOSIS — D129 Benign neoplasm of anus and anal canal: Secondary | ICD-10-CM

## 2021-02-20 DIAGNOSIS — D128 Benign neoplasm of rectum: Secondary | ICD-10-CM | POA: Diagnosis not present

## 2021-02-20 MED ORDER — SODIUM CHLORIDE 0.9 % IV SOLN: 500.0000 mL | Freq: Once | INTRAVENOUS | Status: DC

## 2021-02-20 NOTE — Progress Notes (Signed)
Report given to PACU, vss 

## 2021-02-20 NOTE — Progress Notes (Signed)
Vs by DT in adm 

## 2021-02-20 NOTE — Op Note (Signed)
[place] Patient Name: Carla Cline Procedure Date: 02/20/2021 11:30 AM MRN: [id number] Endoscopist: Justice Britain , MD Age: 61 Referring MD:  Date of Birth: 1960/08/15 Gender: Female Account #: [id number] Procedure:                Colonoscopy Indications:              Screening patient at increased risk: Family history                            of 1st-degree relative with colorectal cancer at                            age 25 years (or older) - 43s per patient report of                            mother, High risk colon cancer surveillance:                            Personal history of colonic polyps Medicines:                Monitored Anesthesia Care Procedure:                Pre-Anesthesia Assessment:                           - Prior to the procedure, a History and Physical                            was performed, and patient medications and                            allergies were reviewed. The patient's tolerance of                            previous anesthesia was also reviewed. The risks                            and benefits of the procedure and the sedation                            options and risks were discussed with the patient.                            All questions were answered, and informed consent                            was obtained. Prior Anticoagulants: The patient has                            taken no previous anticoagulant or antiplatelet                            agents except for NSAID medication. ASA Grade  Assessment: II - A patient with mild systemic                            disease. After reviewing the risks and benefits,                            the patient was deemed in satisfactory condition to                            undergo the procedure.                           After obtaining informed consent, the colonoscope                            was passed under direct vision. Throughout  the                            procedure, the patient's blood pressure, pulse, and                            oxygen saturations were monitored continuously. The                            PCF-HQ190L Colonoscope was introduced through the                            anus and advanced to the the cecum, identified by                            appendiceal orifice and ileocecal valve. The                            colonoscopy was somewhat difficult due to                            significant looping. Successful completion of the                            procedure was aided by changing the patient's                            position, using manual pressure, withdrawing and                            reinserting the scope, straightening and shortening                            the scope to obtain bowel loop reduction and using                            scope torsion. The patient tolerated the procedure.  The quality of the bowel preparation was good. The                            terminal ileum, ileocecal valve, appendiceal                            orifice, and rectum were photographed. Scope In: 11:42:06 AM Scope Out: 11:55:44 AM Scope Withdrawal Time: 0 hours 10 minutes 16 seconds  Total Procedure Duration: 0 hours 13 minutes 38 seconds  Findings:                 The digital rectal exam findings include                            hemorrhoids. Pertinent negatives include no                            palpable rectal lesions.                           The recto-sigmoid colon, sigmoid colon and                            descending colon revealed significantly excessive                            looping.                           Four sessile polyps were found in the rectum (3)                            and recto-sigmoid colon (1). The polyps were 2 to                            10 mm in size. These polyps were removed with a                             cold snare. Resection and retrieval were complete.                           Normal mucosa was found in the entire colon                            otherwise.                           Non-bleeding non-thrombosed internal hemorrhoids                            were found during perianal exam, during digital                            exam and during endoscopy. The hemorrhoids were  Grade II (internal hemorrhoids that prolapse but                            reduce spontaneously). Complications:            No immediate complications. Estimated Blood Loss:     Estimated blood loss was minimal. Impression:               - Hemorrhoids found on digital rectal exam.                           - There was significant looping of the colon.                           - Four 2 to 10 mm polyps in the rectum and at the                            recto-sigmoid colon, removed with a cold snare.                            Resected and retrieved.                           - Normal mucosa in the entire examined colon                            otherwise.                           - Non-bleeding non-thrombosed internal hemorrhoids. Recommendation:           - The patient will be observed post-procedure,                            until all discharge criteria are met.                           - Discharge patient to home.                           - Patient has a contact number available for                            emergencies. The signs and symptoms of potential                            delayed complications were discussed with the                            patient. Return to normal activities tomorrow.                            Written discharge instructions were provided to the                            patient.                           -  High fiber diet.                           - Use FiberCon 1-2 tablets PO daily.                           - Continue present  medications.                           - Await pathology results.                           - Repeat colonoscopy in 3 years for surveillance                            based on size of the largest polyp removed no                            matter other pathology.                           - The findings and recommendations were discussed                            with the patient.                           - The findings and recommendations were discussed                            with the patient's family. Justice Britain, MD 02/20/2021 12:01:48 PM

## 2021-02-20 NOTE — Progress Notes (Signed)
Called to room to assist during endoscopic procedure.  Patient ID and intended procedure confirmed with present staff. Received instructions for my participation in the procedure from the performing physician.  

## 2021-02-20 NOTE — Patient Instructions (Signed)
Handouts provided on polyps, hemorrhoids and high-fiber diet.   Recommend a high-fiber diet (see handout). Use FiberCon 1-2 tablets by mouth daily.    YOU HAD AN ENDOSCOPIC PROCEDURE TODAY AT [facility]:   Refer to the procedure report that was given to you for any specific questions about what was found during the examination.  If the procedure report does not answer your questions, please call your gastroenterologist to clarify.  If you requested that your care partner not be given the details of your procedure findings, then the procedure report has been included in a sealed envelope for you to review at your convenience later.  YOU SHOULD EXPECT: Some feelings of bloating in the abdomen. Passage of more gas than usual.  Walking can help get rid of the air that was put into your GI tract during the procedure and reduce the bloating. If you had a lower endoscopy (such as a colonoscopy or flexible sigmoidoscopy) you may notice spotting of blood in your stool or on the toilet paper. If you underwent a bowel prep for your procedure, you may not have a normal bowel movement for a few days.  Please Note:  You might notice some irritation and congestion in your nose or some drainage.  This is from the oxygen used during your procedure.  There is no need for concern and it should clear up in a day or so.  SYMPTOMS TO REPORT IMMEDIATELY:  Following lower endoscopy (colonoscopy or flexible sigmoidoscopy):  Excessive amounts of blood in the stool  Significant tenderness or worsening of abdominal pains  Swelling of the abdomen that is new, acute  Fever of 100F or higher  For urgent or emergent issues, a gastroenterologist can be reached at any hour by calling [phone]. Do not use MyChart messaging for urgent concerns.    DIET:  We do recommend a small meal at first, but then you may proceed to your regular diet.  Drink plenty of fluids but you should avoid alcoholic beverages  for 24 hours.  ACTIVITY:  You should plan to take it easy for the rest of today and you should NOT DRIVE or use heavy machinery until tomorrow (because of the sedation medicines used during the test).    FOLLOW UP: Our staff will call the number listed on your records 48-72 hours following your procedure to check on you and address any questions or concerns that you may have regarding the information given to you following your procedure. If we do not reach you, we will leave a message.  We will attempt to reach you two times.  During this call, we will ask if you have developed any symptoms of COVID 19. If you develop any symptoms (ie: fever, flu-like symptoms, shortness of breath, cough etc.) before then, please call [phone].  If you test positive for Covid 19 in the 2 weeks post procedure, please call and report this information to [place].    If any biopsies were taken you will be contacted by phone or by letter within the next 1-3 weeks.  Please call us at [phone] if you have not heard about the biopsies in 3 weeks.    SIGNATURES/CONFIDENTIALITY: You and/or your care partner have signed paperwork which will be entered into your electronic medical record.  These signatures attest to the fact that that the information above on your After Visit Summary has been reviewed and is understood.  Full responsibility of the confidentiality of this discharge information  lies with you and/or your care-partner.

## 2021-02-20 NOTE — Progress Notes (Signed)
GASTROENTEROLOGY PROCEDURE H&P NOTE   Primary Care Physician: Waldemar Dickens, MD  HPI: Carla Cline is a 61 y.o. female who presents for Colonoscopy for screening.  Past Medical History:  Diagnosis Date   Allergic rhinitis    Appendicitis 2020   Appendicitis 2020   surgery   Asthma    exercise induced   Colon polyps 2011   benign   Hyperlipidemia    Hypertension    Past Surgical History:  Procedure Laterality Date   APPENDECTOMY     CESAREAN SECTION     CHOLECYSTECTOMY  01/14/2008   RHINOPLASTY  90's   Current Outpatient Medications  Medication Sig Dispense Refill   amLODipine (NORVASC) 10 MG tablet Take by mouth.     atorvastatin (LIPITOR) 20 MG tablet Take 20 mg by mouth daily.     budesonide-formoterol (SYMBICORT) 80-4.5 MCG/ACT inhaler Inhale into the lungs.     Cholecalciferol (VITAMIN D) 2000 UNITS tablet Take 2,000 Units by mouth daily.     ibuprofen (ADVIL,MOTRIN) 200 MG tablet Take 600 mg by mouth every 6 (six) hours as needed. For pain     levocetirizine (XYZAL) 5 MG tablet 5 mg daily.     olmesartan (BENICAR) 20 MG tablet Take by mouth.     omeprazole (PRILOSEC) 20 MG capsule Take by mouth.     Probiotic Product (PROBIOTIC DAILY PO) Take 1 capsule by mouth daily.     traZODone (DESYREL) 50 MG tablet SMARTSIG:0.5-2 Tablet(s) By Mouth Every Night PRN     triamterene-hydrochlorothiazide (DYAZIDE) 37.5-25 MG per capsule Take 1 tablet by mouth daily.     EPIPEN 2-PAK 0.3 MG/0.3ML SOAJ injection  (Patient not taking: Reported on 02/20/2021)     Estradiol 10 MCG TABS vaginal tablet 1  applicator vaginally daily for 2 weeks and then twice weekly (Patient not taking: Reported on 02/20/2021) 18 tablet 4   hydrOXYzine (VISTARIL) 50 MG capsule SMARTSIG:1-2 Capsule(s) By Mouth Every 8 Hours PRN (Patient not taking: Reported on 02/20/2021)     mometasone-formoterol (DULERA) 100-5 MCG/ACT AERO 2 puffs then rinse mouth, twice daily maintenance 1 Inhaler 0    mometasone-formoterol (DULERA) 100-5 MCG/ACT AERO Inhale 2 puffs into the lungs 2 (two) times daily. 1 Inhaler 3   PROVENTIL HFA 108 (90 Base) MCG/ACT inhaler INHALE 2 PUFFS INTO THE LUNGS EVERY 4 HOURS DAILY AS NEEDED FOR WHEEZING OR SHORTNESS OF BREATH (Patient not taking: Reported on 02/20/2021) 6.7 Inhaler 0   Current Facility-Administered Medications  Medication Dose Route Frequency Provider Last Rate Last Admin   0.9 %  sodium chloride infusion  500 mL Intravenous Once Sharyn Creamer, MD        Current Outpatient Medications:    amLODipine (NORVASC) 10 MG tablet, Take by mouth., Disp: , Rfl:    atorvastatin (LIPITOR) 20 MG tablet, Take 20 mg by mouth daily., Disp: , Rfl:    budesonide-formoterol (SYMBICORT) 80-4.5 MCG/ACT inhaler, Inhale into the lungs., Disp: , Rfl:    Cholecalciferol (VITAMIN D) 2000 UNITS tablet, Take 2,000 Units by mouth daily., Disp: , Rfl:    ibuprofen (ADVIL,MOTRIN) 200 MG tablet, Take 600 mg by mouth every 6 (six) hours as needed. For pain, Disp: , Rfl:    levocetirizine (XYZAL) 5 MG tablet, 5 mg daily., Disp: , Rfl:    olmesartan (BENICAR) 20 MG tablet, Take by mouth., Disp: , Rfl:    omeprazole (PRILOSEC) 20 MG capsule, Take by mouth., Disp: , Rfl:    Probiotic Product (PROBIOTIC  DAILY PO), Take 1 capsule by mouth daily., Disp: , Rfl:    traZODone (DESYREL) 50 MG tablet, SMARTSIG:0.5-2 Tablet(s) By Mouth Every Night PRN, Disp: , Rfl:    triamterene-hydrochlorothiazide (DYAZIDE) 37.5-25 MG per capsule, Take 1 tablet by mouth daily., Disp: , Rfl:    EPIPEN 2-PAK 0.3 MG/0.3ML SOAJ injection, , Disp: , Rfl:    Estradiol 10 MCG TABS vaginal tablet, 1  applicator vaginally daily for 2 weeks and then twice weekly (Patient not taking: Reported on 02/20/2021), Disp: 18 tablet, Rfl: 4   hydrOXYzine (VISTARIL) 50 MG capsule, SMARTSIG:1-2 Capsule(s) By Mouth Every 8 Hours PRN (Patient not taking: Reported on 02/20/2021), Disp: , Rfl:    mometasone-formoterol (DULERA) 100-5  MCG/ACT AERO, 2 puffs then rinse mouth, twice daily maintenance, Disp: 1 Inhaler, Rfl: 0   mometasone-formoterol (DULERA) 100-5 MCG/ACT AERO, Inhale 2 puffs into the lungs 2 (two) times daily., Disp: 1 Inhaler, Rfl: 3   PROVENTIL HFA 108 (90 Base) MCG/ACT inhaler, INHALE 2 PUFFS INTO THE LUNGS EVERY 4 HOURS DAILY AS NEEDED FOR WHEEZING OR SHORTNESS OF BREATH (Patient not taking: Reported on 02/20/2021), Disp: 6.7 Inhaler, Rfl: 0  Current Facility-Administered Medications:    0.9 %  sodium chloride infusion, 500 mL, Intravenous, Once, Sharyn Creamer, MD Allergies  Allergen Reactions   Other     Seasonal allergies with epi pen if needed   Family History  Problem Relation Age of Onset   Colon cancer Mother    Hypertension Mother    Breast cancer Mother        age 52's   Hypertension Father    Heart disease Father    Colon polyps Neg Hx    Esophageal cancer Neg Hx    Rectal cancer Neg Hx    Stomach cancer Neg Hx    Social History   Socioeconomic History   Marital status: Married    Spouse name: Not on file   Number of children: 2   Years of education: Not on file   Highest education level: Not on file  Occupational History   Occupation: Biochemist, clinical    Employer: [employer]  Tobacco Use   Smoking status: Former    Packs/day: 1.00    Years: 10.00    Pack years: 10.00    Types: Cigarettes    Quit date: 10/15/1983    Years since quitting: 37.3   Smokeless tobacco: Never  Substance and Sexual Activity   Alcohol use: Yes    Comment: weekend; wine(red)   Drug use: No   Sexual activity: Yes    Birth control/protection: None    Comment: First intercourse at 64, less than 5 sexual partners  Other Topics Concern   Not on file  Social History Narrative   Has 1 biological child and 1 adopted child   Social Determinants of Radio broadcast assistant Strain: Not on file  Food Insecurity: Not on file  Transportation Needs: Not on file  Physical Activity: Not on file   Stress: Not on file  Social Connections: Not on file  Intimate Partner Violence: Not on file    Physical Exam: Today's Vitals   02/20/21 1103 02/20/21 1107  BP: 116/67   Pulse: 91   Temp: 98.4 F (36.9 C) 98.4 F (36.9 C)  TempSrc: Skin   SpO2: 94%   Weight: 153 lb (69.4 kg)   Height: 4\' 10"  (1.473 m)    Body mass index is 31.98 kg/m. GEN: NAD EYE: Sclerae anicteric ENT:  MMM CV: Non-tachycardic GI: Soft, NT/ND NEURO:  Alert & Oriented x 3  Lab Results: No results for input(s): WBC, HGB, HCT, PLT in the last 72 hours. BMET No results for input(s): NA, K, CL, CO2, GLUCOSE, BUN, CREATININE, CALCIUM in the last 72 hours. LFT No results for input(s): PROT, ALBUMIN, AST, ALT, ALKPHOS, BILITOT, BILIDIR, IBILI in the last 72 hours. PT/INR No results for input(s): LABPROT, INR in the last 72 hours.   Impression / Plan: This is a 61 y.o.female who presents for Colonoscopy for screening.  The risks and benefits of endoscopic evaluation/treatment were discussed with the patient and/or family; these include but are not limited to the risk of perforation, infection, bleeding, missed lesions, lack of diagnosis, severe illness requiring hospitalization, as well as anesthesia and sedation related illnesses.  The patient's history has been reviewed, patient examined, no change in status, and deemed stable for procedure.  The patient and/or family is agreeable to proceed.    Justice Britain, MD [facility] Office # [phone]

## 2021-02-22 ENCOUNTER — Telehealth: Payer: Self-pay

## 2021-02-22 NOTE — Telephone Encounter (Signed)
Mailbox is full so unable to leave message.

## 2021-02-22 NOTE — Telephone Encounter (Signed)
°  Follow up Call-  Call back number 02/20/2021  Post procedure Call Back phone  # [phone]  Permission to leave phone message Yes  Some recent data might be hidden     Patient questions:  Do you have a fever, pain , or abdominal swelling? No. Pain Score  0 *  Have you tolerated food without any problems? Yes.    Have you been able to return to your normal activities? Yes.    Do you have any questions about your discharge instructions: Diet   No. Medications  No. Follow up visit  No.  Do you have questions or concerns about your Care? No.  Actions: * If pain score is 4 or above: No action needed, pain <4.  Have you developed a fever since your procedure? no  2.   Have you had an respiratory symptoms (SOB or cough) since your procedure? no  3.   Have you tested positive for COVID 19 since your procedure no  4.   Have you had any family members/close contacts diagnosed with the COVID 19 since your procedure?  no   If yes to any of these questions please route to Joylene John, RN and Joella Prince, RN

## 2021-02-24 ENCOUNTER — Encounter: Payer: Self-pay | Admitting: Gastroenterology

## 2022-01-18 LAB — LAB REPORT - SCANNED: EGFR: 76

## 2022-04-10 LAB — HM MAMMOGRAPHY

## 2022-10-28 ENCOUNTER — Ambulatory Visit: Payer: BLUE CROSS/BLUE SHIELD | Admitting: Family Medicine

## 2022-10-28 ENCOUNTER — Encounter: Payer: Self-pay | Admitting: Family Medicine

## 2022-10-28 VITALS — BP 132/88 | HR 67 | Temp 98.3°F | Ht 59.0 in | Wt 157.9 lb

## 2022-10-28 DIAGNOSIS — Z23 Encounter for immunization: Secondary | ICD-10-CM

## 2022-10-28 DIAGNOSIS — I10 Essential (primary) hypertension: Secondary | ICD-10-CM

## 2022-10-28 DIAGNOSIS — E7800 Pure hypercholesterolemia, unspecified: Secondary | ICD-10-CM

## 2022-10-28 MED ORDER — OLMESARTAN MEDOXOMIL 20 MG PO TABS
20.0000 mg | ORAL_TABLET | Freq: Every day | ORAL | 1 refills | Status: DC
Start: 2022-10-28 — End: 2023-04-20

## 2022-10-28 MED ORDER — TRIAMTERENE-HCTZ 37.5-25 MG PO CAPS
1.0000 | ORAL_CAPSULE | Freq: Every day | ORAL | 1 refills | Status: DC
Start: 2022-10-28 — End: 2023-04-20

## 2022-10-28 MED ORDER — AMLODIPINE BESYLATE 10 MG PO TABS
10.0000 mg | ORAL_TABLET | Freq: Every day | ORAL | 1 refills | Status: DC
Start: 2022-10-28 — End: 2023-04-20

## 2022-10-28 MED ORDER — ATORVASTATIN CALCIUM 20 MG PO TABS
20.0000 mg | ORAL_TABLET | Freq: Every day | ORAL | 1 refills | Status: DC
Start: 2022-10-28 — End: 2023-04-29

## 2022-10-28 NOTE — Assessment & Plan Note (Signed)
I rechecked her BP in office and it was 180/90. I will restart all of her blood pressure medications. She will continue to check her BP at home daily until she reaches her goal. I will see her back in 4 months for her annual physical and bloodwork.

## 2022-10-28 NOTE — Assessment & Plan Note (Signed)
Reviewed last lipid panel, LDL is 136 which is not exactly at goal, I recommended that we recheck in 3 months and then decide if she needs a dosage increase, will continue the 20 mg daily for now.

## 2022-10-28 NOTE — Progress Notes (Signed)
New Patient Office Visit  Subjective    Patient ID: Carla Cline, female    DOB: 23-Oct-1960  Age: 62 y.o. MRN: [id number]  CC:  Chief Complaint  Patient presents with   Establish Care    HPI Carla Cline presents to establish care Patient has a history of HTN, states she ran out of her blood pressure medication and her BP has been elevated at home. She reports some ringing in her ears but no chest pain, no SOB, no dizziness or headaches, has been out of her medication for about 1 month.   Pt also reports a history of HLD, on statin medication. She brought me her labs from January 2024 which I reviewed with her in the visit today, her LDL was still around 136 despite the use of atorvastatin. We discussed her 10 year ASCVD risk and the possibility of increasing the dose of her statin medication.   Pt has a history of mild intermittent asthma, she is on Breo and PRN albuterol inhaler.   Pt also reports a history of chronic allergies, she sees an allergist for this and is on zyxal and singulair. She reports her symptoms are under good control currently.   I have reviewed all aspects of the patient's medical history including social, family, and surgical history.   Current Outpatient Medications  Medication Instructions   albuterol (VENTOLIN HFA) 108 (90 Base) MCG/ACT inhaler 1-2 puffs, Inhalation, As needed   amLODipine (NORVASC) 10 mg, Oral, Daily   atorvastatin (LIPITOR) 20 mg, Oral, Daily   budesonide-formoterol (SYMBICORT) 80-4.5 MCG/ACT inhaler Inhalation   cyanocobalamin (VITAMIN B12) 1,000 mcg, Oral, Daily   EPIPEN 2-PAK 0.3 MG/0.3ML SOAJ injection No dose, route, or frequency recorded.   FIBER PO Oral   Fluticasone Furoate-Vilanterol (BREO ELLIPTA IN) Inhalation, As needed   levocetirizine (XYZAL) 5 mg, Daily   montelukast (SINGULAIR) 10 MG tablet SMARTSIG:1 Tablet(s) By Mouth Every Evening   Multiple Vitamins-Minerals (MULTIVITAMIN ADULTS PO) Oral, Daily    olmesartan (BENICAR) 20 mg, Oral, Daily   omeprazole (PRILOSEC) 20 MG capsule Oral   triamterene-hydrochlorothiazide (DYAZIDE) 37.5-25 MG capsule 1 capsule, Oral, Daily   Vitamin D 2,000 Units, Daily    Past Medical History:  Diagnosis Date   Allergic rhinitis    Appendicitis 2020   Appendicitis 2020   surgery   Asthma    exercise induced   Colon polyps 2011   benign   Hyperlipidemia    Hypertension     Past Surgical History:  Procedure Laterality Date   APPENDECTOMY     CESAREAN SECTION     CHOLECYSTECTOMY  01/14/2008   RHINOPLASTY  90's    Family History  Problem Relation Age of Onset   Colon cancer Mother    Hypertension Mother    Breast cancer Mother        age 71's   Hypertension Father    Heart disease Father    Colon polyps Neg Hx    Esophageal cancer Neg Hx    Rectal cancer Neg Hx    Stomach cancer Neg Hx     Social History   Socioeconomic History   Marital status: Married    Spouse name: Not on file   Number of children: 2   Years of education: Not on file   Highest education level: Associate degree: occupational, Scientist, product/process development, or vocational program  Occupational History   Occupation: Engineer, production    Employer: [employer]  Tobacco Use   Smoking status: Former  Current packs/day: 0.00    Average packs/day: 1 pack/day for 10.0 years (10.0 ttl pk-yrs)    Types: Cigarettes    Start date: 10/14/1973    Quit date: 10/15/1983    Years since quitting: 39.0   Smokeless tobacco: Never  Vaping Use   Vaping status: Never Used  Substance and Sexual Activity   Alcohol use: Yes    Comment: weekend; wine(red)   Drug use: No   Sexual activity: Yes    Birth control/protection: None    Comment: First intercourse at 65, less than 5 sexual partners  Other Topics Concern   Not on file  Social History Narrative   Has 1 biological child and 1 adopted child   Social Determinants of Health   Financial Resource Strain: Low Risk  (10/25/2022)   Overall  Financial Resource Strain (CARDIA)    Difficulty of Paying Living Expenses: Not hard at all  Food Insecurity: No Food Insecurity (10/25/2022)   Hunger Vital Sign    Worried About Running Out of Food in the Last Year: Never true    Ran Out of Food in the Last Year: Never true  Transportation Needs: No Transportation Needs (10/25/2022)   PRAPARE - Administrator, Civil Service (Medical): No    Lack of Transportation (Non-Medical): No  Physical Activity: Insufficiently Active (10/25/2022)   Exercise Vital Sign    Days of Exercise per Week: 2 days    Minutes of Exercise per Session: 60 min  Stress: No Stress Concern Present (10/25/2022)   [employer] of Occupational Health - Occupational Stress Questionnaire    Feeling of Stress : Not at all  Social Connections: Moderately Integrated (10/25/2022)   Social Connection and Isolation Panel [NHANES]    Frequency of Communication with Friends and Family: Once a week    Frequency of Social Gatherings with Friends and Family: Once a week    Attends Religious Services: More than 4 times per year    Active Member of [employer] or Organizations: Yes    Attends Banker Meetings: More than 4 times per year    Marital Status: Married  Catering manager Violence: Unknown (04/19/2021)   Received from [facility]   HITS    Physically Hurt: Not on file    Insult or Talk Down To: Not on file    Threaten Physical Harm: Not on file    Scream or Curse: Not on file    Review of Systems  All other systems reviewed and are negative.       Objective    BP 132/88 (BP Location: Left Arm, Patient Position: Sitting, Cuff Size: Large)   Pulse 67   Temp 98.3 F (36.8 C) (Oral)   Ht 4\' 11"  (1.499 m)   Wt 157 lb 14.4 oz (71.6 kg)   LMP 01/14/2012   SpO2 97%   BMI 31.89 kg/m   Physical Exam Vitals reviewed.  Constitutional:      Appearance: Normal appearance. She is well-groomed and normal weight.  Eyes:      Conjunctiva/sclera: Conjunctivae normal.  Neck:     Thyroid: No thyromegaly.  Cardiovascular:     Rate and Rhythm: Normal rate and regular rhythm.     Pulses: Normal pulses.     Heart sounds: S1 normal and S2 normal.  Pulmonary:     Effort: Pulmonary effort is normal.     Breath sounds: Normal breath sounds and air entry.  Abdominal:     General: Bowel  sounds are normal.  Musculoskeletal:     Right lower leg: No edema.     Left lower leg: No edema.  Neurological:     Mental Status: She is alert and oriented to person, place, and time. Mental status is at baseline.     Gait: Gait is intact.  Psychiatric:        Mood and Affect: Mood and affect normal.        Speech: Speech normal.        Behavior: Behavior normal.        Judgment: Judgment normal.         Assessment & Plan:  Primary hypertension Assessment & Plan: I rechecked her BP in office and it was 180/90. I will restart all of her blood pressure medications. She will continue to check her BP at home daily until she reaches her goal. I will see her back in 4 months for her annual physical and bloodwork.   Orders: -     amLODIPine Besylate; Take 1 tablet (10 mg total) by mouth daily.  Dispense: 90 tablet; Refill: 1 -     Triamterene-HCTZ; Take 1 each (1 capsule total) by mouth daily.  Dispense: 90 capsule; Refill: 1 -     Olmesartan Medoxomil; Take 1 tablet (20 mg total) by mouth daily.  Dispense: 90 tablet; Refill: 1 -     Comprehensive metabolic panel; Future -     CBC with Differential/Platelet; Future  Hypercholesteremia Assessment & Plan: Reviewed last lipid panel, LDL is 136 which is not exactly at goal, I recommended that we recheck in 3 months and then decide if she needs a dosage increase, will continue the 20 mg daily for now.   Orders: -     Atorvastatin Calcium; Take 1 tablet (20 mg total) by mouth daily.  Dispense: 90 tablet; Refill: 1 -     Lipid panel; Future  Immunization due -     Flu vaccine  trivalent PF, 6mos and older(Flulaval,Afluria,Fluarix,Fluzone)  Need for immunization against influenza    Return in about 4 months (around 02/13/2023) for annual physical exam.   Karie Georges, MD

## 2022-10-30 ENCOUNTER — Encounter: Payer: Self-pay | Admitting: Family Medicine

## 2022-12-10 ENCOUNTER — Encounter: Payer: Self-pay | Admitting: Family Medicine

## 2022-12-10 DIAGNOSIS — R110 Nausea: Secondary | ICD-10-CM

## 2022-12-15 MED ORDER — ONDANSETRON HCL 4 MG PO TABS: 4.0000 mg | ORAL_TABLET | Freq: Three times a day (TID) | ORAL | 0 refills | Status: AC | PRN

## 2022-12-15 NOTE — Telephone Encounter (Signed)
Script sent  

## 2022-12-15 NOTE — Telephone Encounter (Signed)
Spoke with the patient and informed her of the message below.  Patient stated she will be traveling later this week and would like to have the Rx sent to [facility].  Message sent to PCP.

## 2022-12-15 NOTE — Telephone Encounter (Signed)
I just saw this -- would you be able to call the patient and find out if it is still needed?

## 2023-02-27 ENCOUNTER — Encounter: Payer: BLUE CROSS/BLUE SHIELD | Admitting: Family Medicine

## 2023-04-19 ENCOUNTER — Other Ambulatory Visit: Payer: Self-pay | Admitting: Family Medicine

## 2023-04-19 DIAGNOSIS — E7800 Pure hypercholesterolemia, unspecified: Secondary | ICD-10-CM

## 2023-04-19 DIAGNOSIS — I10 Essential (primary) hypertension: Secondary | ICD-10-CM

## 2023-04-19 NOTE — Telephone Encounter (Signed)
 Patient needs a follow up visit with me, please message the patient and ask her to schedule, then ok to refill these scripts until her appt date

## 2023-04-20 ENCOUNTER — Other Ambulatory Visit: Payer: Self-pay | Admitting: Family Medicine

## 2023-04-20 ENCOUNTER — Encounter: Payer: Self-pay | Admitting: Family Medicine

## 2023-04-20 DIAGNOSIS — E7800 Pure hypercholesterolemia, unspecified: Secondary | ICD-10-CM

## 2023-04-20 DIAGNOSIS — I10 Essential (primary) hypertension: Secondary | ICD-10-CM

## 2023-04-20 MED ORDER — OLMESARTAN MEDOXOMIL 20 MG PO TABS: 20.0000 mg | ORAL_TABLET | Freq: Every day | ORAL | 1 refills | Status: AC

## 2023-04-20 MED ORDER — AMLODIPINE BESYLATE 10 MG PO TABS: 10.0000 mg | ORAL_TABLET | Freq: Every day | ORAL | 1 refills | Status: AC

## 2023-04-20 MED ORDER — TRIAMTERENE-HCTZ 37.5-25 MG PO CAPS: 1.0000 | ORAL_CAPSULE | Freq: Every day | ORAL | 1 refills | Status: DC

## 2023-04-22 ENCOUNTER — Ambulatory Visit: Admitting: Internal Medicine

## 2023-04-22 ENCOUNTER — Other Ambulatory Visit: Payer: Self-pay

## 2023-04-22 ENCOUNTER — Encounter: Payer: Self-pay | Admitting: Internal Medicine

## 2023-04-22 VITALS — BP 132/78 | HR 83 | Temp 97.7°F | Ht 59.0 in | Wt 157.2 lb

## 2023-04-22 DIAGNOSIS — J22 Unspecified acute lower respiratory infection: Secondary | ICD-10-CM | POA: Diagnosis not present

## 2023-04-22 DIAGNOSIS — I10 Essential (primary) hypertension: Secondary | ICD-10-CM

## 2023-04-22 DIAGNOSIS — J4521 Mild intermittent asthma with (acute) exacerbation: Secondary | ICD-10-CM | POA: Diagnosis not present

## 2023-04-22 MED ORDER — PROMETHAZINE-DM 6.25-15 MG/5ML PO SYRP: 5.0000 mL | ORAL_SOLUTION | Freq: Four times a day (QID) | ORAL | 0 refills | Status: AC | PRN

## 2023-04-22 MED ORDER — ALBUTEROL SULFATE HFA 108 (90 BASE) MCG/ACT IN AERS: 1.0000 | INHALATION_SPRAY | RESPIRATORY_TRACT | 1 refills | Status: AC | PRN

## 2023-04-22 MED ORDER — PREDNISONE 50 MG PO TABS: 50.0000 mg | ORAL_TABLET | Freq: Every day | ORAL | 0 refills | Status: AC

## 2023-04-22 MED ORDER — AZITHROMYCIN 250 MG PO TABS: ORAL_TABLET | ORAL | 0 refills | Status: AC

## 2023-04-22 NOTE — Progress Notes (Signed)
 Chief Complaint  Patient presents with   Cough    Pt c/o cough. Going on for 2 wks. Coughing up yellow phlegm. Pt reports chills, sweating, fatigue. Loss of appetite. Sneezing , congestion. Also trouble sleeping due to cough. Had diarrhea. Took imodium. Went to [facility] on Tuesday, tested negative for covid and flu.  Finished phenergan DM and still on tessalon. Pt added sx nausea and SOB. Pt states she was in [place] 2 wks ago. Pt would like a refill on albuterol inhaler.     HPI: Carla Cline 63 y.o. come in for sda  pcp DR M   Seen [facility]  on April 5  for 2 weeks? Of cough   declined x ray  given couhg med plus supportive care   also had nausea  diarreha predate visit with ? Fever  covid screen negative  Rescue  use recently 3-4 x per day.  No fever .    States coughing  when takes deep breath  on going and  cough meds not that helpful.  Used to be on Symbicort but cose was prohibitive 600$?  So not taking  controller inhaler.  ROS: See pertinent positives and negatives per HPI.tight cough  no hemoptysis  Saw dr Judie Petit for first time  10 24 and was to come in for cpe in 4 mos  but didn't happen  yet .  Past Medical History:  Diagnosis Date   Allergic rhinitis    Appendicitis 2020   Appendicitis 2020   surgery   Asthma    exercise induced   Colon polyps 2011   benign   Hyperlipidemia    Hypertension     Family History  Problem Relation Age of Onset   Colon cancer Mother    Hypertension Mother    Breast cancer Mother        age 19's   Hypertension Father    Heart disease Father    Colon polyps Neg Hx    Esophageal cancer Neg Hx    Rectal cancer Neg Hx    Stomach cancer Neg Hx     Social History   Socioeconomic History   Marital status: Married    Spouse name: Not on file   Number of children: 2   Years of education: Not on file   Highest education level: Associate degree: occupational, Scientist, product/process development, or vocational program  Occupational History   Occupation: Government social research officer: [employer]  Tobacco Use   Smoking status: Former    Current packs/day: 0.00    Average packs/day: 1 pack/day for 10.0 years (10.0 ttl pk-yrs)    Types: Cigarettes    Start date: 10/14/1973    Quit date: 10/15/1983    Years since quitting: 39.5   Smokeless tobacco: Never  Vaping Use   Vaping status: Never Used  Substance and Sexual Activity   Alcohol use: Yes    Comment: weekend; wine(red)   Drug use: No   Sexual activity: Yes    Birth control/protection: None    Comment: First intercourse at 53, less than 5 sexual partners  Other Topics Concern   Not on file  Social History Narrative   Has 1 biological child and 1 adopted child   Social Drivers of Corporate investment banker Strain: Low Risk  (10/25/2022)   Overall Financial Resource Strain (CARDIA)    Difficulty of Paying Living Expenses: Not hard at all  Food Insecurity: No Food Insecurity (10/25/2022)  Hunger Vital Sign    Worried About Running Out of Food in the Last Year: Never true    Ran Out of Food in the Last Year: Never true  Transportation Needs: No Transportation Needs (10/25/2022)   PRAPARE - Administrator, Civil Service (Medical): No    Lack of Transportation (Non-Medical): No  Physical Activity: Insufficiently Active (10/25/2022)   Exercise Vital Sign    Days of Exercise per Week: 2 days    Minutes of Exercise per Session: 60 min  Stress: No Stress Concern Present (10/25/2022)   [employer] of Occupational Health - Occupational Stress Questionnaire    Feeling of Stress : Not at all  Social Connections: Moderately Integrated (10/25/2022)   Social Connection and Isolation Panel [NHANES]    Frequency of Communication with Friends and Family: Once a week    Frequency of Social Gatherings with Friends and Family: Once a week    Attends Religious Services: More than 4 times per year    Active Member of [employer] or Organizations: Yes    Attends Engineer, structural:  More than 4 times per year    Marital Status: Married    Outpatient Medications Prior to Visit  Medication Sig Dispense Refill   amLODipine (NORVASC) 10 MG tablet Take 1 tablet (10 mg total) by mouth daily. 30 tablet 1   atorvastatin (LIPITOR) 20 MG tablet Take 1 tablet (20 mg total) by mouth daily. 90 tablet 1   benzonatate (TESSALON) 100 MG capsule Take 100 mg by mouth 3 (three) times daily.     budesonide-formoterol (SYMBICORT) 80-4.5 MCG/ACT inhaler Inhale into the lungs.     Cholecalciferol (VITAMIN D) 2000 UNITS tablet Take 2,000 Units by mouth daily.     cyanocobalamin (VITAMIN B12) 1000 MCG tablet Take 1,000 mcg by mouth daily.     EPIPEN 2-PAK 0.3 MG/0.3ML SOAJ injection      FIBER PO Take by mouth.     Fluticasone Furoate-Vilanterol (BREO ELLIPTA IN) Inhale into the lungs as needed.     levocetirizine (XYZAL) 5 MG tablet 5 mg daily.     montelukast (SINGULAIR) 10 MG tablet SMARTSIG:1 Tablet(s) By Mouth Every Evening     Multiple Vitamins-Minerals (MULTIVITAMIN ADULTS PO) Take by mouth daily.     olmesartan (BENICAR) 20 MG tablet Take 1 tablet (20 mg total) by mouth daily. 30 tablet 1   omeprazole (PRILOSEC) 20 MG capsule Take by mouth.     ondansetron (ZOFRAN) 4 MG tablet Take 1 tablet (4 mg total) by mouth every 8 (eight) hours as needed for nausea or vomiting. 20 tablet 0   triamterene-hydrochlorothiazide (DYAZIDE) 37.5-25 MG capsule TAKE 1 CAPSULE BY MOUTH EVERY DAY 90 capsule 0   albuterol (VENTOLIN HFA) 108 (90 Base) MCG/ACT inhaler Inhale 1-2 puffs into the lungs as needed for wheezing or shortness of breath.     No facility-administered medications prior to visit.     EXAM:  BP 132/78 (BP Location: Right Arm, Patient Position: Sitting, Cuff Size: Normal)   Pulse 83   Temp 97.7 F (36.5 C) (Oral)   Ht 4\' 11"  (1.499 m)   Wt 157 lb 3.2 oz (71.3 kg)   LMP 01/14/2012   SpO2 91%   BMI 31.75 kg/m   Body mass index is 31.75 kg/m.  GENERAL: vitals reviewed and  listed above, alert, oriented, appears well hydrated and in no acute distress ocass cough  no retractions nl speech  HEENT: atraumatic, conjunctiva  clear, no  obvious abnormalities on inspection of external nose and ears tmx nl OP : no lesion edema or exudate  mild erythema NECK: no obvious masses on inspection palpation  LUNGS: tight  dec bs throughout   after duoneb much better air movement  and cough less   no rales heard at that time  CV: HRRR, no clubbing cyanosis or  peripheral edema nl cap refill  MS: moves all extremities without noticeable focal  abnormality PSYCH: pleasant and cooperative, no obvious depression or anxiety Lab Results  Component Value Date   WBC 9.1 02/02/2012   HGB 16.9 (H) 02/02/2012   HCT 46.0 02/02/2012   PLT 260 02/02/2012   GLUCOSE 124 (H) 02/02/2012   NA 135 02/02/2012   K 3.8 02/02/2012   CL 99 02/02/2012   CREATININE 0.79 02/02/2012   BUN 19 02/02/2012   CO2 24 02/02/2012   BP Readings from Last 3 Encounters:  04/22/23 132/78  10/28/22 132/88  02/20/21 127/77    ASSESSMENT AND PLAN:  Discussed the following assessment and plan:  Mild intermittent asthma with acute exacerbation  Acute respiratory infection - over 2 weeks  initial Sounds like infection triggered asthma  flare   uncertain of underlying infection but will add azithromycin in addition to pred burst .   And fu with pcp in 2 weeks   advised best to be on controller inhaler even  prn.   Refilled phenergan dm  may not be helpful .  Sig improvement in  air flow after duonebulizer  -Patient advised to return or notify health care team  if  new concerns arise.  Patient Instructions  Acts like asthamtic  triggered bay infection .  Pred and antibioic and refill albuterol    Need to find a controller inhaler that  is affordable  for you  Cough meds dont work that great   cough is from wheezing.  Plan fu  in 2 weeks . With PCP   Neta Mends. Whitlee Sluder M.D.

## 2023-04-22 NOTE — Telephone Encounter (Signed)
 Last office visit: 10/28/2022  Pt is aware to schedule an cpe with Dr. Casimiro Needle when feeling better.    Please advise on refill request.

## 2023-04-22 NOTE — Patient Instructions (Addendum)
 Acts like asthamtic  triggered bay infection .  Pred and antibioic and refill albuterol    Need to find a controller inhaler that  is affordable  for you  Cough meds dont work that great   cough is from wheezing.  Plan fu  in 2 weeks . With PCP

## 2023-04-28 ENCOUNTER — Other Ambulatory Visit: Payer: Self-pay | Admitting: Family Medicine

## 2023-04-28 NOTE — Telephone Encounter (Unsigned)
 Copied from CRM [id number]. Topic: Clinical - Medication Refill >> Apr 28, 2023  2:14 PM Keitha Pata L wrote: Most Recent Primary Care Visit:  Provider: Daphine Eagle K  Department: [facility]  Visit Type: ACUTE  Date: 04/22/2023  Medication:amLODipine (NORVASC) 10 MG tablet atorvastatin (LIPITOR) 20 MG tablet olmesartan (BENICAR) 20 MG tablet   Has the patient contacted their pharmacy? Yes (Agent: If no, request that the patient contact the pharmacy for the refill. If patient does not wish to contact the pharmacy document the reason why and proceed with request.) (Agent: If yes, when and what did the pharmacy advise?)  Is this the correct pharmacy for this prescription? Yes If no, delete pharmacy and type the correct one.  This is the patient's preferred pharmacy:  [id number] - [place], [place] - [address] AT [address] [facility] [address] [place] [address] Phone: [phone] Fax: [phone]   Has the prescription been filled recently? Yes  Is the patient out of the medication? Yes  Has the patient been seen for an appointment in the last year OR does the patient have an upcoming appointment? Yes  Can we respond through MyChart? Yes  Agent: Please be advised that Rx refills may take up to 3 business days. We ask that you follow-up with your pharmacy.

## 2023-04-29 MED ORDER — ATORVASTATIN CALCIUM 20 MG PO TABS: 20.0000 mg | ORAL_TABLET | Freq: Every day | ORAL | 0 refills | Status: DC

## 2023-05-14 ENCOUNTER — Telehealth: Payer: Self-pay | Admitting: *Deleted

## 2023-05-14 NOTE — Telephone Encounter (Signed)
 I called [facility] and spoke with the pharmacist Sjiny as the Rx was sent for a 30-day supply and received on 4/16.  Shiny stated the Rx went through and will be ready for pick up this afternoon.  Spoke with the patient and informed her of this also and advised she is overdue for the visit as PCP recommended a visit in January.  Patient stated she does not have coverage, has to pay $500 for each visit, will contact billing and call back for an appt.

## 2023-05-14 NOTE — Telephone Encounter (Signed)
 Copied from CRM [id number]. Topic: Clinical - Prescription Issue >> Apr 29, 2023  9:40 AM Ovid Blow wrote: Reason for CRM: patient is having issues with medication atorvastatin  (LIPITOR) 20 MG tablet. Patient is upset and need her medication. Medication was order but may be refused. Please contact patient regarding this medication

## 2023-06-08 ENCOUNTER — Other Ambulatory Visit: Payer: Self-pay | Admitting: Family Medicine

## 2023-06-08 DIAGNOSIS — E7800 Pure hypercholesterolemia, unspecified: Secondary | ICD-10-CM

## 2023-06-09 ENCOUNTER — Other Ambulatory Visit: Payer: Self-pay | Admitting: Family Medicine

## 2023-06-09 DIAGNOSIS — E7800 Pure hypercholesterolemia, unspecified: Secondary | ICD-10-CM

## 2023-06-17 ENCOUNTER — Other Ambulatory Visit: Payer: Self-pay | Admitting: Family Medicine

## 2023-06-17 DIAGNOSIS — I10 Essential (primary) hypertension: Secondary | ICD-10-CM

## 2023-06-17 NOTE — Telephone Encounter (Signed)
 Patient informed of the message below and stated she is now being seen at [facility].  Rx denial sent.

## 2023-06-17 NOTE — Telephone Encounter (Signed)
 Hey it looks like she established care with a different PCP at [facility] -- can you call and confirm this for me? If so then refuse the rx. Thanks!
